# Patient Record
Sex: Male | Born: 2000 | Race: White | Hispanic: No | Marital: Single | Smoking: Never smoker
Health system: Southern US, Community
[De-identification: ages and names within clinical notes are randomized; demographics above are authoritative.]

## PROBLEM LIST (undated history)

## (undated) DIAGNOSIS — F840 Autistic disorder: Secondary | ICD-10-CM

## (undated) DIAGNOSIS — E119 Type 2 diabetes mellitus without complications: Secondary | ICD-10-CM

---
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
## 2020-09-27 DIAGNOSIS — F3181 Bipolar II disorder: Secondary | ICD-10-CM | POA: Diagnosis present

## 2021-03-07 DIAGNOSIS — M216X1 Other acquired deformities of right foot: Secondary | ICD-10-CM | POA: Insufficient documentation

## 2021-03-07 DIAGNOSIS — M2010 Hallux valgus (acquired), unspecified foot: Secondary | ICD-10-CM | POA: Insufficient documentation

## 2021-06-25 ENCOUNTER — Emergency Department
Admission: EM | Admit: 2021-06-25 | Discharge: 2021-06-25 | Disposition: A | Discharge status: Home / Self Care | Payer: Medicaid Other | Attending: Emergency Medicine | Admitting: Emergency Medicine

## 2021-06-25 ENCOUNTER — Emergency Department: Payer: Medicaid Other

## 2021-06-25 ENCOUNTER — Other Ambulatory Visit: Payer: Self-pay

## 2021-06-25 DIAGNOSIS — R112 Nausea with vomiting, unspecified: Secondary | ICD-10-CM | POA: Diagnosis not present

## 2021-06-25 DIAGNOSIS — Z20822 Contact with and (suspected) exposure to covid-19: Secondary | ICD-10-CM | POA: Insufficient documentation

## 2021-06-25 DIAGNOSIS — E860 Dehydration: Secondary | ICD-10-CM | POA: Diagnosis not present

## 2021-06-25 DIAGNOSIS — R1011 Right upper quadrant pain: Secondary | ICD-10-CM | POA: Insufficient documentation

## 2021-06-25 HISTORY — DX: Type 2 diabetes mellitus without complications: E11.9

## 2021-06-25 HISTORY — DX: Autistic disorder: F84.0

## 2021-06-25 LAB — COMPREHENSIVE METABOLIC PANEL
ALT: 12 U/L (ref 0–44)
AST: 18 U/L (ref 15–41)
Albumin: 5.3 g/dL — ABNORMAL HIGH (ref 3.5–5.0)
Alkaline Phosphatase: 153 U/L — ABNORMAL HIGH (ref 38–126)
Anion gap: 12 (ref 5–15)
BUN: 16 mg/dL (ref 6–20)
CO2: 28 mmol/L (ref 22–32)
Calcium: 10.3 mg/dL (ref 8.9–10.3)
Chloride: 94 mmol/L — ABNORMAL LOW (ref 98–111)
Creatinine, Ser: 1.41 mg/dL — ABNORMAL HIGH (ref 0.61–1.24)
GFR, Estimated: >60 mL/min (ref >60)
Glucose, Bld: 193 mg/dL — ABNORMAL HIGH (ref 70–99)
Potassium: 3.4 mmol/L — ABNORMAL LOW (ref 3.5–5.1)
Sodium: 134 mmol/L — ABNORMAL LOW (ref 135–145)
Total Bilirubin: 1.9 mg/dL — ABNORMAL HIGH (ref 0.3–1.2)
Total Protein: 8.4 g/dL — ABNORMAL HIGH (ref 6.5–8.1)

## 2021-06-25 LAB — CBC
HCT: 37.1 % — ABNORMAL LOW (ref 39.0–52.0)
Hemoglobin: 12.9 g/dL — ABNORMAL LOW (ref 13.0–17.0)
MCH: 29.5 pg (ref 26.0–34.0)
MCHC: 34.8 g/dL (ref 30.0–36.0)
MCV: 84.7 fL (ref 80.0–100.0)
Platelets: 232 10*3/uL (ref 150–400)
RBC: 4.38 MIL/uL (ref 4.22–5.81)
RDW: 12.7 % (ref 11.5–15.5)
WBC: 12.9 10*3/uL — ABNORMAL HIGH (ref 4.0–10.5)
nRBC: 0 % (ref 0.0–0.2)

## 2021-06-25 LAB — RESP PANEL BY RT-PCR (FLU A&B, COVID) ARPGX2
Influenza A by PCR: NEGATIVE
Influenza B by PCR: NEGATIVE
SARS Coronavirus 2 by RT PCR: NEGATIVE

## 2021-06-25 LAB — URINALYSIS, ROUTINE W REFLEX MICROSCOPIC
Bacteria, UA: NONE SEEN
Bilirubin Urine: NEGATIVE
Glucose, UA: NEGATIVE mg/dL
Hgb urine dipstick: NEGATIVE
Ketones, ur: 5 mg/dL — AB
Leukocytes,Ua: NEGATIVE
Nitrite: NEGATIVE
Protein, ur: NEGATIVE mg/dL
Specific Gravity, Urine: 1.01 (ref 1.005–1.030)
Squamous Epithelial / HPF: NONE SEEN (ref 0–5)
pH: 8 (ref 5.0–8.0)

## 2021-06-25 LAB — LACTIC ACID, PLASMA: Lactic Acid, Venous: 1.7 mmol/L (ref 0.5–1.9)

## 2021-06-25 LAB — LIPASE, BLOOD: Lipase: 22 U/L (ref 11–51)

## 2021-06-25 LAB — CBG MONITORING, ED: Glucose-Capillary: 185 mg/dL — ABNORMAL HIGH (ref 70–99)

## 2021-06-25 MED ORDER — SODIUM CHLORIDE 0.9 % IV BOLUS: 500.0000 mL | Freq: Once | INTRAVENOUS | Status: DC

## 2021-06-25 MED ORDER — LACTATED RINGERS IV BOLUS
1000.0000 mL | Freq: Once | INTRAVENOUS | Status: AC
  Administered 2021-06-25: 1000 mL via INTRAVENOUS

## 2021-06-25 MED ORDER — IOHEXOL 300 MG/ML  SOLN
100.0000 mL | Freq: Once | INTRAMUSCULAR | Status: AC | PRN
  Administered 2021-06-25: 100 mL via INTRAVENOUS

## 2021-06-25 MED ORDER — ONDANSETRON HCL 4 MG/2ML IJ SOLN
4.0000 mg | Freq: Once | INTRAMUSCULAR | Status: AC
  Administered 2021-06-25: 4 mg via INTRAVENOUS
  Filled 2021-06-25: qty 2

## 2021-06-25 NOTE — ED Triage Notes (Signed)
Pt in via EMS from a  Group home. Pt is autistic, bipolar and diabetic, no one from the care home is with pt. Pt has been vomiting since Thursday. Pt sitting in WR drinking his Emesis from bag. New bag given. Facility reports pt has to be given a sedative prior to getting labs or other things done. Pt reports pain to upper abd and cough as well

## 2021-06-25 NOTE — Discharge Instructions (Signed)

## 2021-06-25 NOTE — ED Triage Notes (Signed)
RN to triage room. Urine on floor, patient had large emesis on chair. Patient repeating what this RN says, will not answer any questions. No facility rep here with patient.

## 2021-06-25 NOTE — ED Notes (Signed)
Patient responds t verbal stimuli and repeats what you say to him. Skin cold and mottled. Place on monitor and 12 lead completed. IV inserted by EDP with IV ultrasound. BC drawn. Lab work sent to lab. Placed in hospital gown.

## 2021-06-25 NOTE — ED Notes (Signed)
Pt returned from CT via WC with CT tech. 

## 2021-06-25 NOTE — ED Notes (Signed)
Urine sent to lab.  Pt brought more water per request & saltine crackers for PO challenge. Pt able to keep water down so far.

## 2021-06-25 NOTE — ED Notes (Signed)
This RN called & spoke with lab re: respiratory panel not yet received but was sent at 1205; they will receive it now.

## 2021-06-25 NOTE — ED Notes (Signed)
Pt sleeping, resting comfortably in bed, NAD, chest rise & fall. No needs identified at this time. Bed low & locked; call light & personal items within reach. 

## 2021-06-25 NOTE — ED Notes (Signed)
Pt given water to drink, per request of caregiver at [facility].

## 2021-06-25 NOTE — ED Provider Notes (Signed)
Presentation and work-up is reassuring.  Patient tolerating p.o. and feels much better after IV fluids.  At this point do suspect viral illness but given his well appearance I think is appropriate for outpatient follow-up.  We discussed return precautions.  He does have as needed antiemetics at home does not need additional prescription.  No additional questions or concerns from caregiver at bedside.   Willy Eddy, MD 06/25/21 1725

## 2021-06-25 NOTE — ED Provider Notes (Signed)
[facility] Provider Note    Event Date/Time   First MD Initiated Contact with Patient 06/25/21 1138     (approximate)   History   Emesis   HPI  Phillip Barajas is a 21 y.o. male  here with reported abd pain, emesis. History limited 2/2 autism. Per report, pt has had 2 days of n/v, abd pain and discomfort. He is unable to provide any history, repeats what is asked of him on my interview.   Level 5 caveat invoked as remainder of history, ROS, and physical exam limited due to patient's autism.       Physical Exam   Triage Vital Signs: ED Triage Vitals  Enc Vitals Group     BP 06/25/21 1124 (!) 147/130     Pulse Rate 06/25/21 1125 97     Resp 06/25/21 1124 19     Temp 06/25/21 1219 98.4 F (36.9 C)     Temp Source 06/25/21 1219 Oral     SpO2 06/25/21 1125 95 %     Weight 06/25/21 1123 150 lb (68 kg)     Height 06/25/21 1123 5\' 10"  (1.778 m)     Head Circumference --      Peak Flow --      Pain Score --      Pain Loc --      Pain Edu? --      Excl. in [place]? --     Most recent vital signs: Vitals:   06/25/21 1300 06/25/21 1400  BP: (!) 138/92   Pulse: 76 83  Resp: (!) 24 15  Temp:    SpO2: 100% 100%     General: Awake, no distress.  CV:  Good peripheral perfusion. Normal rate. No murmurs, rubs, gallops. Resp:  Normal effort. Normal WOB. Clear breath sounds bilaterally. Abd:  No distention. Slight discomfort noted with RLQ/RUQ palpation. No rebound or guarding. Other:  Appears overall slightly uncomfortable, unable to provide history however - just repeats questions when asked directly. MAE. No apparent CN deficits.   ED Results / Procedures / Treatments   Labs (all labs ordered are listed, but only abnormal results are displayed) Labs Reviewed  COMPREHENSIVE METABOLIC PANEL - Abnormal; Notable for the following components:      Result Value   Sodium 134 (*)    Potassium 3.4 (*)    Chloride 94 (*)    Glucose, Bld 193 (*)     Creatinine, Ser 1.41 (*)    Total Protein 8.4 (*)    Albumin 5.3 (*)    Alkaline Phosphatase 153 (*)    Total Bilirubin 1.9 (*)    All other components within normal limits  CBC - Abnormal; Notable for the following components:   WBC 12.9 (*)    Hemoglobin 12.9 (*)    HCT 37.1 (*)    All other components within normal limits  BLOOD GAS, VENOUS - Abnormal; Notable for the following components:   pH, Ven 7.44 (*)    pO2, Ven 61.0 (*)    Bicarbonate 29.9 (*)    Acid-Base Excess 5.0 (*)    All other components within normal limits  CBG MONITORING, ED - Abnormal; Notable for the following components:   Glucose-Capillary 185 (*)    All other components within normal limits  RESP PANEL BY RT-PCR (FLU A&B, COVID) ARPGX2  CULTURE, BLOOD (SINGLE)  LIPASE, BLOOD  LACTIC ACID, PLASMA  URINALYSIS, ROUTINE W REFLEX MICROSCOPIC  BETA-HYDROXYBUTYRIC ACID  LACTIC ACID,  PLASMA     EKG     RADIOLOGY CXR: I reviewed images, which show, no acute abnormality; agree with Radiology findings CT A/P: Reviewed images, no acute abnormality noted on my read, no appendicitis seen; agree with Radiology findings    PROCEDURES:  Critical Care performed: No  None   MEDICATIONS ORDERED IN ED: Medications  sodium chloride 0.9 % bolus 500 mL (has no administration in time range)  lactated ringers bolus 1,000 mL (0 mLs Intravenous Stopped 06/25/21 1336)  ondansetron (ZOFRAN) injection 4 mg (4 mg Intravenous Given 06/25/21 1356)  lactated ringers bolus 1,000 mL (0 mLs Intravenous Stopped 06/25/21 1455)  iohexol (OMNIPAQUE) 300 MG/ML solution 100 mL (100 mLs Intravenous Contrast Given 06/25/21 1320)     IMPRESSION / MDM / ASSESSMENT AND PLAN / ED COURSE  I reviewed the triage vital signs and the nursing notes.                               Ddx: viral GI illness, DKA, food-borne illness, colitis, appendicitis, cholecystitis, PNA w/ vomiting, UTI  21 yo M with T1DM here with nausea, vomiting. Pt  reportedly vomiting x several days. Dehydrated clinically, IVF started with IV zofran for nausea. Lab work is overall fairly reassuring. Glu 193, CO2 28 and AG normal - no signs of DKA. Bili elevated at 1.9 but LFTs normal. Possible mild AKI noted though baseline Cr not known. LA is normal. Mild leukocytosis noted. Pt is afebrile and non-toxic. CT A/P obtained, reviewed, and is unremarkable. CXR also reviewed and is clear.  Suspect possible viral vs food-borne illness with n/v. Pt given fluids, antiemetics. Given hyperbilirubinemia, will check U/S as pt cannot tell me if he has RUQ pain. Otherwise, will plan to PO challenge, f/u imaging and UA, and reassess.   MEDICATIONS GIVEN IN ED: Medications  sodium chloride 0.9 % bolus 500 mL (has no administration in time range)  lactated ringers bolus 1,000 mL (0 mLs Intravenous Stopped 06/25/21 1336)  ondansetron (ZOFRAN) injection 4 mg (4 mg Intravenous Given 06/25/21 1356)  lactated ringers bolus 1,000 mL (0 mLs Intravenous Stopped 06/25/21 1455)  iohexol (OMNIPAQUE) 300 MG/ML solution 100 mL (100 mLs Intravenous Contrast Given 06/25/21 1320)       Consults: None   EMR reviewed PCP notes from 11/22, reviewed, show T1DM, o/w unremarkable.     FINAL CLINICAL IMPRESSION(S) / ED DIAGNOSES   Final diagnoses:  RUQ pain  Dehydration  Nausea and vomiting, unspecified vomiting type     Rx / DC Orders   ED Discharge Orders     None        Note:  This document was prepared using Dragon voice recognition software and may include unintentional dictation errors.   Duffy Bruce, MD 06/25/21 [phone]

## 2021-06-25 NOTE — ED Notes (Signed)
Called and spoke with Group home Production designer, [employer]tele[employer]. She reported patient sick since Thursday so she told staff to send patient via EMS to hospital as he is a Type I diabetic. She is unsure of when she will be able to send staff to sit with him. She verified he has no allergies.

## 2021-06-30 LAB — CULTURE, BLOOD (SINGLE): Culture: NO GROWTH

## 2021-07-13 LAB — BLOOD GAS, VENOUS
Acid-Base Excess: 5 mmol/L — ABNORMAL HIGH (ref 0.0–2.0)
Bicarbonate: 29.9 mmol/L — ABNORMAL HIGH (ref 20.0–28.0)
O2 Saturation: 91.9 %
Patient temperature: 37
pCO2, Ven: 44 mmHg (ref 44.0–60.0)
pH, Ven: 7.44 — ABNORMAL HIGH (ref 7.250–7.430)
pO2, Ven: 61 mmHg — ABNORMAL HIGH (ref 32.0–45.0)

## 2021-08-15 ENCOUNTER — Other Ambulatory Visit: Payer: Self-pay

## 2021-08-15 ENCOUNTER — Emergency Department
Admission: EM | Admit: 2021-08-15 | Discharge: 2021-08-15 | Disposition: A | Discharge status: Home / Self Care | Payer: Medicaid Other | Attending: Emergency Medicine | Admitting: Emergency Medicine

## 2021-08-15 DIAGNOSIS — R7989 Other specified abnormal findings of blood chemistry: Secondary | ICD-10-CM | POA: Insufficient documentation

## 2021-08-15 DIAGNOSIS — E119 Type 2 diabetes mellitus without complications: Secondary | ICD-10-CM | POA: Diagnosis not present

## 2021-08-15 DIAGNOSIS — F840 Autistic disorder: Secondary | ICD-10-CM | POA: Diagnosis not present

## 2021-08-15 DIAGNOSIS — R112 Nausea with vomiting, unspecified: Secondary | ICD-10-CM | POA: Diagnosis not present

## 2021-08-15 LAB — COMPREHENSIVE METABOLIC PANEL
ALT: 19 U/L (ref 0–44)
AST: 21 U/L (ref 15–41)
Albumin: 4.6 g/dL (ref 3.5–5.0)
Alkaline Phosphatase: 146 U/L — ABNORMAL HIGH (ref 38–126)
Anion gap: 9 (ref 5–15)
BUN: 13 mg/dL (ref 6–20)
CO2: 28 mmol/L (ref 22–32)
Calcium: 10.6 mg/dL — ABNORMAL HIGH (ref 8.9–10.3)
Chloride: 100 mmol/L (ref 98–111)
Creatinine, Ser: 1.52 mg/dL — ABNORMAL HIGH (ref 0.61–1.24)
GFR, Estimated: >60 mL/min (ref >60)
Glucose, Bld: 114 mg/dL — ABNORMAL HIGH (ref 70–99)
Potassium: 3.9 mmol/L (ref 3.5–5.1)
Sodium: 137 mmol/L (ref 135–145)
Total Bilirubin: 0.8 mg/dL (ref 0.3–1.2)
Total Protein: 7.3 g/dL (ref 6.5–8.1)

## 2021-08-15 LAB — CBC
HCT: 33.1 % — ABNORMAL LOW (ref 39.0–52.0)
Hemoglobin: 10.9 g/dL — ABNORMAL LOW (ref 13.0–17.0)
MCH: 29.3 pg (ref 26.0–34.0)
MCHC: 32.9 g/dL (ref 30.0–36.0)
MCV: 89 fL (ref 80.0–100.0)
Platelets: 266 10*3/uL (ref 150–400)
RBC: 3.72 MIL/uL — ABNORMAL LOW (ref 4.22–5.81)
RDW: 13.3 % (ref 11.5–15.5)
WBC: 9.8 10*3/uL (ref 4.0–10.5)
nRBC: 0 % (ref 0.0–0.2)

## 2021-08-15 LAB — URINALYSIS, COMPLETE (UACMP) WITH MICROSCOPIC
Bacteria, UA: NONE SEEN
Bilirubin Urine: NEGATIVE
Glucose, UA: NEGATIVE mg/dL
Hgb urine dipstick: NEGATIVE
Ketones, ur: NEGATIVE mg/dL
Leukocytes,Ua: NEGATIVE
Nitrite: NEGATIVE
Protein, ur: NEGATIVE mg/dL
Specific Gravity, Urine: 1.003 — ABNORMAL LOW (ref 1.005–1.030)
Squamous Epithelial / HPF: NONE SEEN (ref 0–5)
pH: 8 (ref 5.0–8.0)

## 2021-08-15 LAB — LIPASE, BLOOD: Lipase: 27 U/L (ref 11–51)

## 2021-08-15 MED ORDER — SODIUM CHLORIDE 0.9 % IV BOLUS
1000.0000 mL | Freq: Once | INTRAVENOUS | Status: AC
  Administered 2021-08-15: 1000 mL via INTRAVENOUS

## 2021-08-15 MED ORDER — ONDANSETRON HCL 4 MG/2ML IJ SOLN
4.0000 mg | Freq: Once | INTRAMUSCULAR | Status: AC
  Administered 2021-08-15: 4 mg via INTRAVENOUS
  Filled 2021-08-15: qty 2

## 2021-08-15 NOTE — ED Provider Notes (Signed)
[facility] Provider Note    Event Date/Time   First MD Initiated Contact with Patient 08/15/21 1332     (approximate)  History   Chief Complaint: Emesis  HPI  Phillip Barajas is a 21 y.o. male with a past medical history of autism, diabetes, presents to the emergency department for nausea and vomiting.  According to the patient's group home caregiver patient has been nauseated with intermittent episodes of vomiting over the past 4 months or so.  States the episodes of vomiting have been increasing in frequency and now occur on a daily basis.  Patient has been following up with [facility] and has an appointment for an endoscopy coming up next month.  I reviewed the patient's [facility] notes including pediatric endocrinology 08/03/2021.  Group home staff states they tried to give the patient has oral nausea medication at home but did not appear to be working so they came to the emergency department for evaluation.  Patient has autism but does not appear to be in any acute distress, lying in bed.  Does appear somewhat pale.  Physical Exam   Triage Vital Signs: ED Triage Vitals [08/15/21 1319]  Enc Vitals Group     BP      Pulse      Resp      Temp      Temp src      SpO2      Weight      Height      Head Circumference      Peak Flow      Pain Score 0     Pain Loc      Pain Edu?      Excl. in [place]?     Most recent vital signs: There were no vitals filed for this visit.  General: Awake, no distress.  Calm and cooperative. CV:  Good peripheral perfusion.  Regular rate and rhythm  Resp:  Normal effort.  Equal breath sounds bilaterally.  Abd:  No distention.  Soft, nontender.  No rebound or guarding.  No reaction to abdominal palpation.    ED Results / Procedures / Treatments   MEDICATIONS ORDERED IN ED: Medications  sodium chloride 0.9 % bolus 1,000 mL (has no administration in time range)  ondansetron (ZOFRAN) injection 4 mg (has no administration in time  range)     IMPRESSION / MDM / ASSESSMENT AND PLAN / ED COURSE  I reviewed the triage vital signs and the nursing notes.  Patient presents to the emergency department for several months of nausea and vomiting with increasing frequency, not responsive to his home nausea medication today so he came to the emergency department.  I reviewed the patient's records he is currently being worked up by [employer] appears to be waiting for GI appointment.  I reviewed the patient's ER visits and the patient had a CT scan of abdomen/pelvis last month with no acute findings.  Patient has a benign abdominal exam today.  We will recheck labs in the emergency department.  We will IV hydrate and treat the patient's nausea while awaiting results.  Group home staff agreeable.  Overall patient appears well, no distress not actively vomiting.  CBC is normal.  Chemistry is normal.  Lipase is normal.  Chemistry does show creatinine 1.5 however this is largely unchanged from prior lab work.  Urinalysis is pending.  We will continue with IV hydration.  Patient has not vomited in the emergency department since receiving medication.  Patient  care signed out to oncoming provider.  If urinalysis is normal and the patient is feeling better anticipate likely discharge home with GI follow-up.  FINAL CLINICAL IMPRESSION(S) / ED DIAGNOSES   Nausea vomiting    Note:  This document was prepared using Dragon voice recognition software and may include unintentional dictation errors.   Harvest Dark, MD 08/15/21 1511

## 2021-08-15 NOTE — Discharge Instructions (Signed)
Please seek medical attention for any high fevers, chest pain, shortness of breath, change in behavior, persistent vomiting, bloody stool or any other new or concerning symptoms.  

## 2021-08-15 NOTE — ED Notes (Signed)
Pt got up and started walking in the hallway. Redirected back in the room. Caregiver left about 1hr ago. This RN called group home and informed them that pt's ready for dc. Per Randal Buba, staff she will call pt's ride.

## 2021-08-15 NOTE — ED Triage Notes (Addendum)
Pt comes with c/o vomiting and nausea for 3 weeks. Pt is nonverbal and severe autism per EMS. CAregiver with pt at this time. EMS reports bright yellow vomit.  Pt is actively vomiting in waiting room.  Pt is from [facility] group [facility]. Mom is legal guardian per staff.

## 2021-08-26 ENCOUNTER — Encounter: Payer: Self-pay | Admitting: Emergency Medicine

## 2021-08-26 ENCOUNTER — Emergency Department: Payer: Medicaid Other

## 2021-08-26 ENCOUNTER — Other Ambulatory Visit: Payer: Self-pay

## 2021-08-26 ENCOUNTER — Emergency Department
Admission: EM | Admit: 2021-08-26 | Discharge: 2021-08-26 | Disposition: A | Discharge status: Home / Self Care | Payer: Medicaid Other | Attending: Emergency Medicine | Admitting: Emergency Medicine

## 2021-08-26 DIAGNOSIS — R569 Unspecified convulsions: Secondary | ICD-10-CM | POA: Insufficient documentation

## 2021-08-26 DIAGNOSIS — Z20822 Contact with and (suspected) exposure to covid-19: Secondary | ICD-10-CM | POA: Diagnosis not present

## 2021-08-26 DIAGNOSIS — F840 Autistic disorder: Secondary | ICD-10-CM | POA: Diagnosis not present

## 2021-08-26 LAB — COMPREHENSIVE METABOLIC PANEL
ALT: 14 U/L (ref 0–44)
AST: 23 U/L (ref 15–41)
Albumin: 4.4 g/dL (ref 3.5–5.0)
Alkaline Phosphatase: 123 U/L (ref 38–126)
Anion gap: 8 (ref 5–15)
BUN: 14 mg/dL (ref 6–20)
CO2: 27 mmol/L (ref 22–32)
Calcium: 9.8 mg/dL (ref 8.9–10.3)
Chloride: 100 mmol/L (ref 98–111)
Creatinine, Ser: 1.51 mg/dL — ABNORMAL HIGH (ref 0.61–1.24)
GFR, Estimated: >60 mL/min (ref >60)
Glucose, Bld: 114 mg/dL — ABNORMAL HIGH (ref 70–99)
Potassium: 4 mmol/L (ref 3.5–5.1)
Sodium: 135 mmol/L (ref 135–145)
Total Bilirubin: 0.6 mg/dL (ref 0.3–1.2)
Total Protein: 7.1 g/dL (ref 6.5–8.1)

## 2021-08-26 LAB — CBC WITH DIFFERENTIAL/PLATELET
Abs Immature Granulocytes: 0.01 10*3/uL (ref 0.00–0.07)
Basophils Absolute: 0 10*3/uL (ref 0.0–0.1)
Basophils Relative: 1 %
Eosinophils Absolute: 0.2 10*3/uL (ref 0.0–0.5)
Eosinophils Relative: 3 %
HCT: 32.1 % — ABNORMAL LOW (ref 39.0–52.0)
Hemoglobin: 10.5 g/dL — ABNORMAL LOW (ref 13.0–17.0)
Immature Granulocytes: 0 %
Lymphocytes Relative: 18 %
Lymphs Abs: 1.3 10*3/uL (ref 0.7–4.0)
MCH: 29 pg (ref 26.0–34.0)
MCHC: 32.7 g/dL (ref 30.0–36.0)
MCV: 88.7 fL (ref 80.0–100.0)
Monocytes Absolute: 0.5 10*3/uL (ref 0.1–1.0)
Monocytes Relative: 8 %
Neutro Abs: 5 10*3/uL (ref 1.7–7.7)
Neutrophils Relative %: 70 %
Platelets: 200 10*3/uL (ref 150–400)
RBC: 3.62 MIL/uL — ABNORMAL LOW (ref 4.22–5.81)
RDW: 13.6 % (ref 11.5–15.5)
WBC: 7 10*3/uL (ref 4.0–10.5)
nRBC: 0 % (ref 0.0–0.2)

## 2021-08-26 LAB — RESP PANEL BY RT-PCR (FLU A&B, COVID) ARPGX2
Influenza A by PCR: NEGATIVE
Influenza B by PCR: NEGATIVE
SARS Coronavirus 2 by RT PCR: NEGATIVE

## 2021-08-26 LAB — ETHANOL: Alcohol, Ethyl (B): <10 mg/dL (ref <10)

## 2021-08-26 LAB — ACETAMINOPHEN LEVEL: Acetaminophen (Tylenol), Serum: <10 ug/mL — ABNORMAL LOW (ref 10–30)

## 2021-08-26 LAB — SALICYLATE LEVEL: Salicylate Lvl: <7 mg/dL — ABNORMAL LOW (ref 7.0–30.0)

## 2021-08-26 MED ORDER — LORAZEPAM 2 MG/ML IJ SOLN
1.0000 mg | Freq: Once | INTRAMUSCULAR | Status: DC
Start: 2021-08-26 — End: 2021-08-26

## 2021-08-26 MED ORDER — LORAZEPAM 2 MG/ML IJ SOLN
INTRAMUSCULAR | Status: AC
  Filled 2021-08-26: qty 1

## 2021-08-26 MED ORDER — SODIUM CHLORIDE 0.9 % IV BOLUS
1000.0000 mL | Freq: Once | INTRAVENOUS | Status: AC
  Administered 2021-08-26: 1000 mL via INTRAVENOUS

## 2021-08-26 MED ORDER — LORAZEPAM 2 MG/ML IJ SOLN
1.0000 mg | Freq: Once | INTRAMUSCULAR | Status: AC
  Administered 2021-08-26: 1 mg via INTRAVENOUS

## 2021-08-26 NOTE — ED Notes (Signed)
Pt mother and group home owner notified of patient's arrival and status,  ?

## 2021-08-26 NOTE — ED Provider Notes (Addendum)
? ?[facility] ?Provider Note ? ? ? Event Date/Time  ? First MD Initiated Contact with Patient 08/26/21 1422   ?  (approximate) ? ? ?History  ? ?seizure like activity ? ? ?HPI ? ?Phillip Barajas is a 21 y.o. male with autism spectrum disorder who is brought to the ED due to a suspected seizure at school.  He has no seizure history.  No recent illness, no recent trauma. ? ?Patient reports that he feels cold and tired, but denies any other acute symptoms.  Denies headache or vision change.  Denies vomiting or diarrhea.  Reports that he has been eating and drinking normally. ?  ? ? ?Physical Exam  ? ?Triage Vital Signs: ?ED Triage Vitals [08/26/21 1420]  ?Enc Vitals Group  ?   BP 113/78  ?   Pulse Rate 70  ?   Resp 18  ?   Temp 98.2 ?F (36.8 ?C)  ?   Temp Source Oral  ?   SpO2 100 %  ?   Weight   ?   Height   ?   Head Circumference   ?   Peak Flow   ?   Pain Score   ?   Pain Loc   ?   Pain Edu?   ?   Excl. in [place]?   ? ? ?Most recent vital signs: ?Vitals:  ? 08/26/21 1420  ?BP: 113/78  ?Pulse: 70  ?Resp: 18  ?Temp: 98.2 ?F (36.8 ?C)  ?SpO2: 100%  ? ? ? ?General: Awake, no distress.  ?CV:  Good peripheral perfusion.  Regular rate and rhythm ?Resp:  Normal effort.  Clear to auscultation bilaterally ?Abd:  No distention.  Moist oral mucosa ?Other:  No tongue biting, no wounds.  Full range of motion all extremities.  No nystagmus.  PERRL, EOMI. ? ? ?ED Results / Procedures / Treatments  ? ?Labs ?(all labs ordered are listed, but only abnormal results are displayed) ?Labs Reviewed  ?CBC WITH DIFFERENTIAL/PLATELET - Abnormal; Notable for the following components:  ?    Result Value  ? RBC 3.62 (*)   ? Hemoglobin 10.5 (*)   ? HCT 32.1 (*)   ? All other components within normal limits  ?RESP PANEL BY RT-PCR (FLU A&B, COVID) ARPGX2  ?ACETAMINOPHEN LEVEL  ?COMPREHENSIVE METABOLIC PANEL  ?ETHANOL  ?SALICYLATE LEVEL  ?URINALYSIS, ROUTINE W REFLEX MICROSCOPIC  ?URINE DRUG SCREEN, QUALITATIVE ([facility] ONLY)   ? ? ? ?EKG ? ?EKG viewed and interpreted by me, normal sinus rhythm rate of 71.  Normal axis, normal intervals.  Normal QRS ST segments and T waves. ? ? ?RADIOLOGY ?CT head viewed and by me, negative for mass or intracranial hemorrhage.  Radiology report reviewed. ? ? ? ?PROCEDURES: ? ?Critical Care performed: No ? ?Procedures ? ? ?MEDICATIONS ORDERED IN ED: ?Medications  ?sodium chloride 0.9 % bolus 1,000 mL (has no administration in time range)  ?LORazepam (ATIVAN) 2 MG/ML injection (has no administration in time range)  ?LORazepam (ATIVAN) injection 1 mg (1 mg Intravenous Given 08/26/21 1423)  ? ? ? ?IMPRESSION / MDM / ASSESSMENT AND PLAN / ED COURSE  ?I reviewed the triage vital signs and the nursing notes. ?             ?               ? ?Differential diagnosis includes, but is not limited to, intracranial hemorrhage, new onset seizure, dehydration, electrode abnormality, ingestion, syncope ? ?Patient presents with  a suspected seizure without past history.  Will obtain labs, COVID/flu swab, CT head. ? ? ?Clinical Course as of 08/26/21 1445  ?Fri Aug 26, 2021  ?1444 CT head viewed and interpreted by me, negative for mass or intracranial hemorrhage.  Radiology report reviewed. [PS]  ?  ?Clinical Course User Index ?[PS] Sharman Cheek, MD  ? ? ? ?FINAL CLINICAL IMPRESSION(S) / ED DIAGNOSES  ? ?Final diagnoses:  ?Seizure-like activity (H[facility])  ? ? ? ?Rx / DC Orders  ? ?ED Discharge Orders   ? ? None  ? ?  ? ? ? ?Note:  This document was prepared using Dragon voice recognition software and may include unintentional dictation errors. ?  ?Sharman Cheek, MD ?08/26/21 1444 ? ?  ?Sharman Cheek, MD ?08/26/21 1505 ? ?

## 2021-08-26 NOTE — ED Notes (Signed)
This RN attempted to contact mother with no answer at this time. 

## 2021-08-26 NOTE — ED Notes (Signed)
Group home staff member here to pick patient up. E-signature not working at this time. Group home staff member verbalized understanding of D/C instructions, prescriptions and follow up care with no further questions at this time. Pt in NAD and ambulatory at time of D/C. ? ?

## 2021-08-26 NOTE — ED Triage Notes (Signed)
Pt presents via ace[facility] from highschool with c/o seizure like activity. Patient was reported to have fallen during class and had "shaking" activity that lasted about one minute. Pt was reported to be lethargic after episode. Upon arrival, patient lethargic and will answer yes or no to some questions. Per group home, this is patient's baseline verbal status.  ?

## 2021-09-02 DIAGNOSIS — R569 Unspecified convulsions: Secondary | ICD-10-CM | POA: Insufficient documentation

## 2021-09-12 ENCOUNTER — Emergency Department: Payer: Medicaid Other

## 2021-09-12 ENCOUNTER — Encounter: Payer: Self-pay | Admitting: Intensive Care

## 2021-09-12 ENCOUNTER — Other Ambulatory Visit: Payer: Self-pay

## 2021-09-12 ENCOUNTER — Emergency Department
Admission: EM | Admit: 2021-09-12 | Discharge: 2021-09-12 | Disposition: A | Discharge status: Home / Self Care | Payer: Medicaid Other | Source: Home / Self Care | Attending: Emergency Medicine | Admitting: Emergency Medicine

## 2021-09-12 ENCOUNTER — Encounter: Payer: Self-pay | Admitting: Emergency Medicine

## 2021-09-12 ENCOUNTER — Telehealth: Payer: Self-pay

## 2021-09-12 ENCOUNTER — Inpatient Hospital Stay
Admission: EM | Admit: 2021-09-12 | Discharge: 2021-09-14 | DRG: 918 | Disposition: A | Discharge status: Home / Self Care | Payer: Medicaid Other | Attending: Internal Medicine | Admitting: Internal Medicine

## 2021-09-12 DIAGNOSIS — F3181 Bipolar II disorder: Secondary | ICD-10-CM | POA: Diagnosis present

## 2021-09-12 DIAGNOSIS — W01198A Fall on same level from slipping, tripping and stumbling with subsequent striking against other object, initial encounter: Secondary | ICD-10-CM | POA: Insufficient documentation

## 2021-09-12 DIAGNOSIS — R6250 Unspecified lack of expected normal physiological development in childhood: Secondary | ICD-10-CM | POA: Diagnosis present

## 2021-09-12 DIAGNOSIS — T43591A Poisoning by other antipsychotics and neuroleptics, accidental (unintentional), initial encounter: Principal | ICD-10-CM | POA: Diagnosis present

## 2021-09-12 DIAGNOSIS — E10649 Type 1 diabetes mellitus with hypoglycemia without coma: Secondary | ICD-10-CM

## 2021-09-12 DIAGNOSIS — S0081XA Abrasion of other part of head, initial encounter: Secondary | ICD-10-CM | POA: Insufficient documentation

## 2021-09-12 DIAGNOSIS — Y92219 Unspecified school as the place of occurrence of the external cause: Secondary | ICD-10-CM | POA: Insufficient documentation

## 2021-09-12 DIAGNOSIS — F840 Autistic disorder: Secondary | ICD-10-CM

## 2021-09-12 DIAGNOSIS — R569 Unspecified convulsions: Secondary | ICD-10-CM | POA: Insufficient documentation

## 2021-09-12 DIAGNOSIS — E109 Type 1 diabetes mellitus without complications: Secondary | ICD-10-CM | POA: Diagnosis present

## 2021-09-12 DIAGNOSIS — W1830XA Fall on same level, unspecified, initial encounter: Secondary | ICD-10-CM | POA: Diagnosis present

## 2021-09-12 DIAGNOSIS — R112 Nausea with vomiting, unspecified: Secondary | ICD-10-CM

## 2021-09-12 DIAGNOSIS — Z79899 Other long term (current) drug therapy: Secondary | ICD-10-CM

## 2021-09-12 DIAGNOSIS — T56891A Toxic effect of other metals, accidental (unintentional), initial encounter: Principal | ICD-10-CM

## 2021-09-12 DIAGNOSIS — E119 Type 2 diabetes mellitus without complications: Secondary | ICD-10-CM

## 2021-09-12 DIAGNOSIS — F909 Attention-deficit hyperactivity disorder, unspecified type: Secondary | ICD-10-CM

## 2021-09-12 DIAGNOSIS — N179 Acute kidney failure, unspecified: Secondary | ICD-10-CM | POA: Clinically undetermined

## 2021-09-12 DIAGNOSIS — Z20822 Contact with and (suspected) exposure to covid-19: Secondary | ICD-10-CM | POA: Diagnosis present

## 2021-09-12 DIAGNOSIS — G40909 Epilepsy, unspecified, not intractable, without status epilepticus: Secondary | ICD-10-CM | POA: Diagnosis present

## 2021-09-12 DIAGNOSIS — Z794 Long term (current) use of insulin: Secondary | ICD-10-CM

## 2021-09-12 DIAGNOSIS — R7989 Other specified abnormal findings of blood chemistry: Secondary | ICD-10-CM | POA: Insufficient documentation

## 2021-09-12 DIAGNOSIS — Z7984 Long term (current) use of oral hypoglycemic drugs: Secondary | ICD-10-CM

## 2021-09-12 LAB — CBC
HCT: 34.9 % — ABNORMAL LOW (ref 39.0–52.0)
Hemoglobin: 11.4 g/dL — ABNORMAL LOW (ref 13.0–17.0)
MCH: 29.1 pg (ref 26.0–34.0)
MCHC: 32.7 g/dL (ref 30.0–36.0)
MCV: 89 fL (ref 80.0–100.0)
Platelets: 232 10*3/uL (ref 150–400)
RBC: 3.92 MIL/uL — ABNORMAL LOW (ref 4.22–5.81)
RDW: 14 % (ref 11.5–15.5)
WBC: 11 10*3/uL — ABNORMAL HIGH (ref 4.0–10.5)
nRBC: 0 % (ref 0.0–0.2)

## 2021-09-12 LAB — BASIC METABOLIC PANEL
Anion gap: 10 (ref 5–15)
BUN: 12 mg/dL (ref 6–20)
CO2: 24 mmol/L (ref 22–32)
Calcium: 9.9 mg/dL (ref 8.9–10.3)
Chloride: 103 mmol/L (ref 98–111)
Creatinine, Ser: 1.41 mg/dL — ABNORMAL HIGH (ref 0.61–1.24)
GFR, Estimated: >60 mL/min (ref >60)
Glucose, Bld: 103 mg/dL — ABNORMAL HIGH (ref 70–99)
Potassium: 4.1 mmol/L (ref 3.5–5.1)
Sodium: 137 mmol/L (ref 135–145)

## 2021-09-12 LAB — URINALYSIS, ROUTINE W REFLEX MICROSCOPIC
Bilirubin Urine: NEGATIVE
Glucose, UA: NEGATIVE mg/dL
Hgb urine dipstick: NEGATIVE
Ketones, ur: NEGATIVE mg/dL
Leukocytes,Ua: NEGATIVE
Nitrite: NEGATIVE
Protein, ur: NEGATIVE mg/dL
Specific Gravity, Urine: 1.005 (ref 1.005–1.030)
pH: 8 (ref 5.0–8.0)

## 2021-09-12 LAB — CBC WITH DIFFERENTIAL/PLATELET
Abs Immature Granulocytes: 0.02 10*3/uL (ref 0.00–0.07)
Basophils Absolute: 0.1 10*3/uL (ref 0.0–0.1)
Basophils Relative: 1 %
Eosinophils Absolute: 0.2 10*3/uL (ref 0.0–0.5)
Eosinophils Relative: 3 %
HCT: 34.7 % — ABNORMAL LOW (ref 39.0–52.0)
Hemoglobin: 11.1 g/dL — ABNORMAL LOW (ref 13.0–17.0)
Immature Granulocytes: 0 %
Lymphocytes Relative: 15 %
Lymphs Abs: 1.1 10*3/uL (ref 0.7–4.0)
MCH: 29 pg (ref 26.0–34.0)
MCHC: 32 g/dL (ref 30.0–36.0)
MCV: 90.6 fL (ref 80.0–100.0)
Monocytes Absolute: 0.5 10*3/uL (ref 0.1–1.0)
Monocytes Relative: 7 %
Neutro Abs: 5.8 10*3/uL (ref 1.7–7.7)
Neutrophils Relative %: 74 %
Platelets: 198 10*3/uL (ref 150–400)
RBC: 3.83 MIL/uL — ABNORMAL LOW (ref 4.22–5.81)
RDW: 14.1 % (ref 11.5–15.5)
WBC: 7.8 10*3/uL (ref 4.0–10.5)
nRBC: 0 % (ref 0.0–0.2)

## 2021-09-12 LAB — LITHIUM LEVEL: Lithium Lvl: 1.7 mmol/L (ref 0.60–1.20)

## 2021-09-12 MED ORDER — SODIUM CHLORIDE 0.9 % IV BOLUS
1000.0000 mL | Freq: Once | INTRAVENOUS | Status: AC
Start: 2021-09-12 — End: 2021-09-13
  Administered 2021-09-12: 1000 mL via INTRAVENOUS

## 2021-09-12 MED ORDER — ONDANSETRON HCL 4 MG/2ML IJ SOLN
4.0000 mg | Freq: Once | INTRAMUSCULAR | Status: AC
Start: 2021-09-12 — End: 2021-09-12
  Administered 2021-09-12: 4 mg via INTRAVENOUS
  Filled 2021-09-12: qty 2

## 2021-09-12 MED ORDER — GABAPENTIN 400 MG PO CAPS: ORAL_CAPSULE | ORAL | 2 refills | Status: DC

## 2021-09-12 MED ORDER — IOHEXOL 300 MG/ML  SOLN
100.0000 mL | Freq: Once | INTRAMUSCULAR | Status: AC | PRN
  Administered 2021-09-12: 100 mL via INTRAVENOUS

## 2021-09-12 MED ORDER — LAMOTRIGINE 25 MG PO TABS: 25.0000 mg | ORAL_TABLET | Freq: Two times a day (BID) | ORAL | 0 refills | Status: DC

## 2021-09-12 NOTE — ED Triage Notes (Signed)
Arrived by EMS from school. Patient hit left side of face when falling due to seizure. By standers report he stiffened up and hit Left forehead, shaking about 2 minutes. EMS reports postictal at scene. EMS vitals HR 63, 98% RA, 119/80 blood pressure, blood sugar 144. Baseline patient mainly non verbal. Can follow some commands. Lives at [facility] [place], [place] [phone].  ?

## 2021-09-12 NOTE — ED Notes (Signed)
School officer at bedside at this time ?

## 2021-09-12 NOTE — ED Triage Notes (Signed)
ED called Group Home and instructed them to bring patient back because lithium level is high at 1.7 ? ?Awake and alert to baseline.  NAD ?

## 2021-09-12 NOTE — ED Provider Notes (Signed)
? ?[facility] ?Provider Note ? ? ? Event Date/Time  ? First MD Initiated Contact with Patient 09/12/21 1119   ?  (approximate) ? ? ?History  ? ?Seizures ? ? ?HPI ? ?Garland Lyke is a 21 y.o. male with a past medical history of autism spectrum disorder, ADHD, developmental delay and eczema and recent ED evaluation on 3/10 for possible seizure-like activity having follow-up with neurology on 3/16 not started on AEDs at that time who presents via EMS from school where he had an episode concerning for possible seizure-like activity.  He was witnessed to suddenly stiffen and fall hitting his head against the ground.  Per EMS he was postictal with them.  No seizure-like activity witnessed by EMS.  Per EMS report review of records it seems patient is minimally verbal at baseline.  He is unable to contribute any meaningful history on arrival. ? ?I was able to reach staff members group home with Gerald Stabs who confirmed patient baseline is close to nonverbal only able to answer yes or no to some questions and it there have been no other recent sick symptoms or injuries.  He also confirms that patient has not had any changes to medications and only takes lorazepam as needed very intermittently but not daily. ?  ? ? ?Physical Exam  ?Triage Vital Signs: ?ED Triage Vitals  ?Enc Vitals Group  ?   BP   ?   Pulse   ?   Resp   ?   Temp   ?   Temp src   ?   SpO2   ?   Weight   ?   Height   ?   Head Circumference   ?   Peak Flow   ?   Pain Score   ?   Pain Loc   ?   Pain Edu?   ?   Excl. in [place]?   ? ? ?Most recent vital signs: ?Vitals:  ? 09/12/21 1122 09/12/21 1127  ?BP: 125/82   ?Pulse: 68   ?Resp: 14   ?Temp:  98.6 ?F (37 ?C)  ?SpO2: 100%   ? ? ?General: Awake,  ?CV:  Good peripheral perfusion.  2+ radial pulses. ?Resp:  Normal effort.  ?Abd:  No distention.  ?Other:  Patient does not answer any direct questions but is able to assist examiner thumbs up with both hands and raise both legs off the bed on command.   PERRLA.  EOMI.  He has an abrasion of the left side of his face just to the lateral aspect of the eyebrow as well as a very small laceration in the inferior left lower lip.  No other obvious trauma to the face scalp head or neck.  He does not seem tender anywhere along the C/T/L-spine.  No other obvious trauma to the extremities chest abdomen or back.  Oropharynx and ears are unremarkable.  No neck rigidity. ? ? ?ED Results / Procedures / Treatments  ?Labs ?(all labs ordered are listed, but only abnormal results are displayed) ?Labs Reviewed  ?CBC WITH DIFFERENTIAL/PLATELET - Abnormal; Notable for the following components:  ?    Result Value  ? RBC 3.83 (*)   ? Hemoglobin 11.1 (*)   ? HCT 34.7 (*)   ? All other components within normal limits  ?BASIC METABOLIC PANEL - Abnormal; Notable for the following components:  ? Glucose, Bld 103 (*)   ? Creatinine, Ser 1.41 (*)   ? All other components within normal  limits  ?LITHIUM LEVEL  ? ? ? ?EKG ? ?ECG is remarkable for sinus rhythm with a ventricular rate of 66, otherwise unremarkable intervals, normal axis without evidence of acute ischemia or significant arrhythmia. ? ? ?RADIOLOGY ?CT head and C-spine interpreted by myself without evidence of a skull fracture, intracranial hemorrhage or acute C-spine injury.  I also reviewed radiology interpretation and agree with the findings of same. ? ? ?PROCEDURES: ? ?Critical Care performed: No ? ?Procedures ? ? ?MEDICATIONS ORDERED IN ED: ?Medications - No data to display ? ? ?IMPRESSION / MDM / ASSESSMENT AND PLAN / ED COURSE  ?I reviewed the triage vital signs and the nursing notes. ?             ?               ? ?Differential diagnosis includes, but is not limited to arrhythmia, metabolic derangements, possible recurrent seizure with lower suspicion based on the exam for an infectious process.  Based on history from caregivers I have a lower suspicion for benzo withdrawal. ? ?Given patient's limited history and her to fall  with evidence on exam of some facial injury will obtain a CT head and C-spine. ? ?CT head and C-spine interpreted by myself without evidence of a skull fracture, intracranial hemorrhage or acute C-spine injury.  I also reviewed radiology interpretation and agree with the findings of same. ? ?BMP shows no significant electrolyte or metabolic derangements.  Kidney function is at baseline with a creatinine of 1.41 compared to 1.512 weeks ago.  CBC without leukocytosis or acute anemia. ? ?I reviewed patient's most recent neurology note from 3/16 where it seems there was consideration of starting patient on Lamictal for additional seizure activity. ? ?I discussed patient's presentation and work-up with on-call neurologist Dr. Leonel Ramsay who recommended increasing patient's gabapentin to 400 mg in the morning and 800 mg in the evening as well as starting patient on Lamictal as discussed in previous neurology note.  I will have patient follow-up with Dr. Melrose Nakayama his neurologist.  Low suspicion for other immediate life-threatening process.  Discharged in stable condition. ? ?  ? ? ?FINAL CLINICAL IMPRESSION(S) / ED DIAGNOSES  ? ?Final diagnoses:  ?Seizure-like activity ([place])  ?Abrasion of face, initial encounter  ? ? ? ?Rx / DC Orders  ? ?ED Discharge Orders   ? ?      Ordered  ?  lamoTRIgine (LAMICTAL) 25 MG tablet  2 times daily       ? 09/12/21 1309  ?  gabapentin (NEURONTIN) 400 MG capsule       ? 09/12/21 1309  ? ?  ?  ? ?  ? ? ? ?Note:  This document was prepared using Dragon voice recognition software and may include unintentional dictation errors. ?  ?Lucrezia Starch, MD ?09/12/21 1311 ? ?

## 2021-09-12 NOTE — ED Notes (Signed)
Bed alarm hooked up at this time to monitor patient getting out of bed ?

## 2021-09-12 NOTE — ED Notes (Signed)
Pt taken to CT.

## 2021-09-13 DIAGNOSIS — N179 Acute kidney failure, unspecified: Secondary | ICD-10-CM | POA: Diagnosis not present

## 2021-09-13 DIAGNOSIS — S0081XA Abrasion of other part of head, initial encounter: Secondary | ICD-10-CM | POA: Diagnosis present

## 2021-09-13 DIAGNOSIS — Z7984 Long term (current) use of oral hypoglycemic drugs: Secondary | ICD-10-CM | POA: Diagnosis not present

## 2021-09-13 DIAGNOSIS — T56891A Toxic effect of other metals, accidental (unintentional), initial encounter: Secondary | ICD-10-CM

## 2021-09-13 DIAGNOSIS — Z20822 Contact with and (suspected) exposure to covid-19: Secondary | ICD-10-CM | POA: Diagnosis present

## 2021-09-13 DIAGNOSIS — Z794 Long term (current) use of insulin: Secondary | ICD-10-CM | POA: Diagnosis not present

## 2021-09-13 DIAGNOSIS — F3181 Bipolar II disorder: Secondary | ICD-10-CM | POA: Diagnosis present

## 2021-09-13 DIAGNOSIS — F909 Attention-deficit hyperactivity disorder, unspecified type: Secondary | ICD-10-CM

## 2021-09-13 DIAGNOSIS — Z79899 Other long term (current) drug therapy: Secondary | ICD-10-CM | POA: Diagnosis not present

## 2021-09-13 DIAGNOSIS — R6250 Unspecified lack of expected normal physiological development in childhood: Secondary | ICD-10-CM | POA: Diagnosis present

## 2021-09-13 DIAGNOSIS — R569 Unspecified convulsions: Secondary | ICD-10-CM | POA: Diagnosis not present

## 2021-09-13 DIAGNOSIS — T43591A Poisoning by other antipsychotics and neuroleptics, accidental (unintentional), initial encounter: Secondary | ICD-10-CM | POA: Diagnosis not present

## 2021-09-13 DIAGNOSIS — F840 Autistic disorder: Secondary | ICD-10-CM | POA: Diagnosis present

## 2021-09-13 DIAGNOSIS — E119 Type 2 diabetes mellitus without complications: Secondary | ICD-10-CM

## 2021-09-13 DIAGNOSIS — G40909 Epilepsy, unspecified, not intractable, without status epilepticus: Secondary | ICD-10-CM | POA: Diagnosis present

## 2021-09-13 DIAGNOSIS — E109 Type 1 diabetes mellitus without complications: Secondary | ICD-10-CM | POA: Diagnosis present

## 2021-09-13 DIAGNOSIS — R112 Nausea with vomiting, unspecified: Secondary | ICD-10-CM

## 2021-09-13 DIAGNOSIS — W1830XA Fall on same level, unspecified, initial encounter: Secondary | ICD-10-CM | POA: Diagnosis present

## 2021-09-13 HISTORY — DX: Toxic effect of other metals, accidental (unintentional), initial encounter: T56.891A

## 2021-09-13 LAB — CBG MONITORING, ED
Glucose-Capillary: 170 mg/dL — ABNORMAL HIGH (ref 70–99)
Glucose-Capillary: 152 mg/dL — ABNORMAL HIGH (ref 70–99)
Glucose-Capillary: 138 mg/dL — ABNORMAL HIGH (ref 70–99)

## 2021-09-13 LAB — LIPASE, BLOOD: Lipase: 27 U/L (ref 11–51)

## 2021-09-13 LAB — COMPREHENSIVE METABOLIC PANEL
ALT: 11 U/L (ref 0–44)
AST: 18 U/L (ref 15–41)
Albumin: 4.7 g/dL (ref 3.5–5.0)
Alkaline Phosphatase: 107 U/L (ref 38–126)
Anion gap: 10 (ref 5–15)
BUN: 14 mg/dL (ref 6–20)
CO2: 28 mmol/L (ref 22–32)
Calcium: 10 mg/dL (ref 8.9–10.3)
Chloride: 105 mmol/L (ref 98–111)
Creatinine, Ser: 1.53 mg/dL — ABNORMAL HIGH (ref 0.61–1.24)
GFR, Estimated: >60 mL/min (ref >60)
Glucose, Bld: 108 mg/dL — ABNORMAL HIGH (ref 70–99)
Potassium: 3.6 mmol/L (ref 3.5–5.1)
Sodium: 143 mmol/L (ref 135–145)
Total Bilirubin: 1 mg/dL (ref 0.3–1.2)
Total Protein: 7.7 g/dL (ref 6.5–8.1)

## 2021-09-13 LAB — RESP PANEL BY RT-PCR (FLU A&B, COVID) ARPGX2
Influenza A by PCR: NEGATIVE
Influenza B by PCR: NEGATIVE
SARS Coronavirus 2 by RT PCR: NEGATIVE

## 2021-09-13 LAB — LITHIUM LEVEL
Lithium Lvl: 1.93 mmol/L (ref 0.60–1.20)
Lithium Lvl: 1.61 mmol/L (ref 0.60–1.20)

## 2021-09-13 LAB — HIV ANTIBODY (ROUTINE TESTING W REFLEX): HIV Screen 4th Generation wRfx: NONREACTIVE

## 2021-09-13 LAB — HEMOGLOBIN A1C
Hgb A1c MFr Bld: 4.7 % — ABNORMAL LOW (ref 4.8–5.6)
Mean Plasma Glucose: 88.19 mg/dL

## 2021-09-13 LAB — GLUCOSE, CAPILLARY
Glucose-Capillary: 100 mg/dL — ABNORMAL HIGH (ref 70–99)
Glucose-Capillary: 141 mg/dL — ABNORMAL HIGH (ref 70–99)

## 2021-09-13 LAB — GROUP A STREP BY PCR: Group A Strep by PCR: NOT DETECTED

## 2021-09-13 MED ORDER — INSULIN ASPART 100 UNIT/ML IJ SOLN: 0.0000 [IU] | Freq: Every day | INTRAMUSCULAR | Status: DC

## 2021-09-13 MED ORDER — RISPERIDONE 0.5 MG PO TABS
2.0000 mg | ORAL_TABLET | Freq: Every day | ORAL | Status: DC
Start: 2021-09-13 — End: 2021-09-14
  Administered 2021-09-13 – 2021-09-14 (×2): 2 mg via ORAL
  Filled 2021-09-13: qty 4
  Filled 2021-09-13: qty 2

## 2021-09-13 MED ORDER — LORAZEPAM 2 MG/ML IJ SOLN: 2.0000 mg | INTRAMUSCULAR | Status: DC | PRN

## 2021-09-13 MED ORDER — HYDROCODONE-ACETAMINOPHEN 5-325 MG PO TABS: 1.0000 | ORAL_TABLET | ORAL | Status: DC | PRN

## 2021-09-13 MED ORDER — LORATADINE 10 MG PO TABS
10.0000 mg | ORAL_TABLET | Freq: Every day | ORAL | Status: DC
  Administered 2021-09-13 – 2021-09-14 (×2): 10 mg via ORAL
  Filled 2021-09-13 (×2): qty 1

## 2021-09-13 MED ORDER — INSULIN ASPART 100 UNIT/ML IJ SOLN
0.0000 [IU] | Freq: Three times a day (TID) | INTRAMUSCULAR | Status: DC
  Administered 2021-09-13: 3 [IU] via SUBCUTANEOUS
  Administered 2021-09-13 – 2021-09-14 (×2): 2 [IU] via SUBCUTANEOUS
  Administered 2021-09-14: 3 [IU] via SUBCUTANEOUS
  Filled 2021-09-13 (×4): qty 1

## 2021-09-13 MED ORDER — ACETAMINOPHEN 325 MG PO TABS: 650.0000 mg | ORAL_TABLET | Freq: Four times a day (QID) | ORAL | Status: DC | PRN

## 2021-09-13 MED ORDER — INSULIN GLARGINE-YFGN 100 UNIT/ML ~~LOC~~ SOLN
25.0000 [IU] | Freq: Every day | SUBCUTANEOUS | Status: DC
  Filled 2021-09-13: qty 0.25

## 2021-09-13 MED ORDER — LAMOTRIGINE 25 MG PO TABS
25.0000 mg | ORAL_TABLET | Freq: Two times a day (BID) | ORAL | Status: DC
  Administered 2021-09-13 – 2021-09-14 (×4): 25 mg via ORAL
  Filled 2021-09-13 (×5): qty 1

## 2021-09-13 MED ORDER — ENOXAPARIN SODIUM 40 MG/0.4ML IJ SOSY
40.0000 mg | PREFILLED_SYRINGE | INTRAMUSCULAR | Status: DC
  Administered 2021-09-13 – 2021-09-14 (×2): 40 mg via SUBCUTANEOUS
  Filled 2021-09-13 (×2): qty 0.4

## 2021-09-13 MED ORDER — ONDANSETRON HCL 4 MG PO TABS: 4.0000 mg | ORAL_TABLET | Freq: Four times a day (QID) | ORAL | Status: DC | PRN

## 2021-09-13 MED ORDER — MELATONIN 5 MG PO TABS
10.0000 mg | ORAL_TABLET | Freq: Every day | ORAL | Status: DC
  Administered 2021-09-13 (×2): 10 mg via ORAL
  Filled 2021-09-13 (×2): qty 2

## 2021-09-13 MED ORDER — ACETAMINOPHEN 650 MG RE SUPP
650.0000 mg | Freq: Four times a day (QID) | RECTAL | Status: DC | PRN
Start: 2021-09-13 — End: 2021-09-14

## 2021-09-13 MED ORDER — INSULIN GLARGINE-YFGN 100 UNIT/ML ~~LOC~~ SOLN
25.0000 [IU] | Freq: Every morning | SUBCUTANEOUS | Status: DC
  Administered 2021-09-14: 25 [IU] via SUBCUTANEOUS
  Filled 2021-09-13 (×2): qty 0.25

## 2021-09-13 MED ORDER — GABAPENTIN 300 MG PO CAPS
400.0000 mg | ORAL_CAPSULE | Freq: Every morning | ORAL | Status: DC
  Administered 2021-09-13 – 2021-09-14 (×2): 400 mg via ORAL
  Filled 2021-09-13 (×2): qty 1

## 2021-09-13 MED ORDER — AMANTADINE HCL 100 MG PO CAPS
200.0000 mg | ORAL_CAPSULE | Freq: Every day | ORAL | Status: DC
Start: 2021-09-13 — End: 2021-09-14
  Administered 2021-09-13 – 2021-09-14 (×2): 200 mg via ORAL
  Filled 2021-09-13 (×3): qty 2

## 2021-09-13 MED ORDER — GABAPENTIN 400 MG PO CAPS
800.0000 mg | ORAL_CAPSULE | Freq: Every day | ORAL | Status: DC
  Administered 2021-09-13 (×2): 800 mg via ORAL
  Filled 2021-09-13 (×3): qty 2

## 2021-09-13 MED ORDER — LORAZEPAM 1 MG PO TABS
1.0000 mg | ORAL_TABLET | Freq: Four times a day (QID) | ORAL | Status: DC | PRN
  Administered 2021-09-13 (×2): 1 mg via ORAL
  Filled 2021-09-13 (×2): qty 1

## 2021-09-13 MED ORDER — SENNA 8.6 MG PO TABS: 1.0000 | ORAL_TABLET | Freq: Every evening | ORAL | Status: DC | PRN

## 2021-09-13 MED ORDER — FLUTICASONE PROPIONATE 50 MCG/ACT NA SUSP
2.0000 | Freq: Every day | NASAL | Status: DC
  Administered 2021-09-13: 2 via NASAL
  Filled 2021-09-13: qty 16

## 2021-09-13 MED ORDER — LACTATED RINGERS IV SOLN: INTRAVENOUS | Status: DC

## 2021-09-13 MED ORDER — ONDANSETRON HCL 4 MG/2ML IJ SOLN: 4.0000 mg | Freq: Four times a day (QID) | INTRAMUSCULAR | Status: DC | PRN

## 2021-09-13 MED ORDER — FLUOXETINE HCL 10 MG PO CAPS
10.0000 mg | ORAL_CAPSULE | Freq: Every day | ORAL | Status: DC
  Administered 2021-09-13 – 2021-09-14 (×2): 10 mg via ORAL
  Filled 2021-09-13 (×2): qty 1

## 2021-09-13 MED ORDER — RISPERIDONE 0.5 MG PO TABS
2.0000 mg | ORAL_TABLET | Freq: Two times a day (BID) | ORAL | Status: DC
  Administered 2021-09-13 – 2021-09-14 (×4): 2 mg via ORAL
  Filled 2021-09-13 (×2): qty 2
  Filled 2021-09-13 (×2): qty 4

## 2021-09-13 MED ORDER — CLONIDINE HCL 0.1 MG PO TABS
0.1000 mg | ORAL_TABLET | Freq: Two times a day (BID) | ORAL | Status: DC
  Administered 2021-09-13 – 2021-09-14 (×4): 0.1 mg via ORAL
  Filled 2021-09-13 (×4): qty 1

## 2021-09-13 MED ORDER — AMANTADINE HCL 100 MG PO CAPS
100.0000 mg | ORAL_CAPSULE | Freq: Two times a day (BID) | ORAL | Status: DC
  Administered 2021-09-13 – 2021-09-14 (×3): 100 mg via ORAL
  Filled 2021-09-13 (×4): qty 1

## 2021-09-13 MED ORDER — CLONIDINE HCL 0.1 MG PO TABS
0.0500 mg | ORAL_TABLET | Freq: Every day | ORAL | Status: DC
Start: 2021-09-13 — End: 2021-09-14
  Administered 2021-09-13 – 2021-09-14 (×2): 0.05 mg via ORAL
  Filled 2021-09-13 (×2): qty 1

## 2021-09-13 NOTE — ED Notes (Signed)
Pt given medication and then unhooked to ambulate to toilet. Pt then returned to bed with instructions from RN. Pt very directable.  ?

## 2021-09-13 NOTE — ED Notes (Signed)
Pt up to bathroom at this time.  Denies any other needs. ?

## 2021-09-13 NOTE — Progress Notes (Addendum)
?PROGRESS NOTE ? ? ?HPI was taken from Dr. Para March: ?Phillip Barajas is a 21 y.o. male with medical history significant for Type 1 diabetes, autism, ADHD, developmental delay seen earlier in the ED for seizure-like activity, with neurology consult with Dr. Amada Jupiter recommending increasing gabapentin and starting Lamictal, discharged back to group home who was called back for admission due to an elevated lithium level that returned after his discharge from the emergency room.  On his return to the ED patient was vomiting and was complaining of abdominal pain. ?ED course: Vitals within normal limits.  Blood work with lithium level of 1.93, WBC 11,000, hemoglobin 11.4 and creatinine 1.53.  COVID and flu negative, strep test negative, urinalysis unremarkable, lipase and LFTs WNL.  CT abdomen and pelvis showed no active process. ?Patient started on IV hydration.  Observation admit requested.  ?  ? ? ?Phillip Barajas  EXH:[id number] DOB: March 23, 2001 DOA: 09/12/2021 ?PCP: Pcp, No  ? ?Assessment & Plan: ?  ?Principal Problem: ?  Lithium toxicity ?Active Problems: ?  Nausea and vomiting ?  Recent onset seizure disorder (HCC) ?  Autism ?  Diabetes mellitus without complication (HCC) ?  ADHD ?  Bipolar 2 disorder (HCC) ? ? ?Lithium toxicity: possibly incidental. Lithium level is still elevated but trending down. Hold lithium. Encourage po fluids as pt will not keep in IV ?  ?Recent onset seizure disorder: continue on gabapentin, lamictal. Fall & seizure precautions  ? ?Nausea and vomiting: etiology unclear. Zofran prn. ? ?Likely AKI: Cr is trending up from day prior. Encourage po intake as pt will not keep in IV  ? ?Bipolar 2 disorder: continue on home dose of risperdal, fluoxetine, symmetrel. Continue to hold lithium  ?  ?ADHD: continue on home dose of clonidine  ?  ?DM1: likely poorly controlled. Continue on SSI w/ accuchecks  ?  ?Autism: continue w/ supportive care ? ? ? ?DVT prophylaxis: lovenox  ?Code Status: full ?Family  Communication: called pt's mother, Trula Ore, no answer so I left a message  ?Disposition Plan: likely d/c back home  ? ?Level of care: Progressive ? ?Status is: Observation ?The patient remains OBS appropriate and will d/c before 2 midnights. ? ? ?Consultants:  ? ? ?Procedures: ? ?Antimicrobials:  ? ? ?Subjective: ?Pt denies any complaints  ? ?Objective: ?Vitals:  ? 09/12/21 2246 09/13/21 0447 09/13/21 0719 09/13/21 0730  ?BP: (!) 137/100 (!) 108/56 (!) 103/53 (!) 109/58  ?Pulse: 68 80 68 66  ?Resp: 18 15 16    ?Temp:      ?TempSrc:      ?SpO2: 98% 98% 99% 99%  ?Weight:      ?Height:      ? ?No intake or output data in the 24 hours ending 09/13/21 0813 ?Filed Weights  ? 09/12/21 1844  ?Weight: 68 kg  ? ? ?Examination: ? ?General exam: Appears calm and comfortable  ?Respiratory system: Clear to auscultation. Respiratory effort normal. ?Cardiovascular system: S1 & S2 +. No rubs, gallops or clicks.  ?Gastrointestinal system: Abdomen is nondistended, soft and nontender.  Normal bowel sounds heard. ?Central nervous system: Alert and awake. Moves all extremities  ?Psychiatry: Judgement and insight appears poor. Flat mood and affect  ? ? ? ?Data Reviewed: I have personally reviewed following labs and imaging studies ? ?CBC: ?Recent Labs  ?Lab 09/12/21 ?1122 09/12/21 ?2224  ?WBC 7.8 11.0*  ?NEUTROABS 5.8  --   ?HGB 11.1* 11.4*  ?HCT 34.7* 34.9*  ?MCV 90.6 89.0  ?PLT 198 232  ? ?Basic  Metabolic Panel: ?Recent Labs  ?Lab 09/12/21 ?1122 09/12/21 ?2224  ?NA 137 143  ?K 4.1 3.6  ?CL 103 105  ?CO2 24 28  ?GLUCOSE 103* 108*  ?BUN 12 14  ?CREATININE 1.41* 1.53*  ?CALCIUM 9.9 10.0  ? ?GFR: ?Estimated Creatinine Clearance: 73.5 mL/min (A) (by C-G formula based on SCr of 1.53 mg/dL (H)). ?Liver Function Tests: ?Recent Labs  ?Lab 09/12/21 ?2224  ?AST 18  ?ALT 11  ?ALKPHOS 107  ?BILITOT 1.0  ?PROT 7.7  ?ALBUMIN 4.7  ? ?Recent Labs  ?Lab 09/12/21 ?2224  ?LIPASE 27  ? ?No results for input(s): AMMONIA in the last 168 hours. ?Coagulation  Profile: ?No results for input(s): INR, PROTIME in the last 168 hours. ?Cardiac Enzymes: ?No results for input(s): CKTOTAL, CKMB, CKMBINDEX, TROPONINI in the last 168 hours. ?BNP (last 3 results) ?No results for input(s): PROBNP in the last 8760 hours. ?HbA1C: ?Recent Labs  ?  09/13/21 ?0210  ?HGBA1C 4.7*  ? ?CBG: ?Recent Labs  ?Lab 09/13/21 ?0213 09/13/21 ?0719  ?GLUCAP 170* 152*  ? ?Lipid Profile: ?No results for input(s): CHOL, HDL, LDLCALC, TRIG, CHOLHDL, LDLDIRECT in the last 72 hours. ?Thyroid Function Tests: ?No results for input(s): TSH, T4TOTAL, FREET4, T3FREE, THYROIDAB in the last 72 hours. ?Anemia Panel: ?No results for input(s): VITAMINB12, FOLATE, FERRITIN, TIBC, IRON, RETICCTPCT in the last 72 hours. ?Sepsis Labs: ?No results for input(s): PROCALCITON, LATICACIDVEN in the last 168 hours. ? ?Recent Results (from the past 240 hour(s))  ?Resp Panel by RT-PCR (Flu A&B, Covid) Nasopharyngeal Swab     Status: None  ? Collection Time: 09/12/21 11:36 PM  ? Specimen: Nasopharyngeal Swab; Nasopharyngeal(NP) swabs in vial transport medium  ?Result Value Ref Range Status  ? SARS Coronavirus 2 by RT PCR NEGATIVE NEGATIVE Final  ?  Comment: (NOTE) ?SARS-CoV-2 target nucleic acids are NOT DETECTED. ? ?The SARS-CoV-2 RNA is generally detectable in upper respiratory ?specimens during the acute phase of infection. The lowest ?concentration of SARS-CoV-2 viral copies this assay can detect is ?138 copies/mL. A negative result does not preclude SARS-Cov-2 ?infection and should not be used as the sole basis for treatment or ?other patient management decisions. A negative result may occur with  ?improper specimen collection/handling, submission of specimen other ?than nasopharyngeal swab, presence of viral mutation(s) within the ?areas targeted by this assay, and inadequate number of viral ?copies(<138 copies/mL). A negative result must be combined with ?clinical observations, patient history, and  epidemiological ?information. The expected result is Negative. ? ?Fact Sheet for Patients:  ?BloggerCourse.comhttps://www.fda.gov/media/152166/download ? ?Fact Sheet for Healthcare Providers:  ?SeriousBroker.ithttps://www.fda.gov/media/152162/download ? ?This test is no t yet approved or cleared by the [place] FDA and  ?has been authorized for detection and/or diagnosis of SARS-CoV-2 by ?FDA under an Emergency Use Authorization (EUA). This EUA will remain  ?in effect (meaning this test can be used) for the duration of the ?COVID-19 declaration under Section 564(b)(1) of the Act, 21 ?U.S.C.section 360bbb-3(b)(1), unless the authorization is terminated  ?or revoked sooner.  ? ? ?  ? Influenza A by PCR NEGATIVE NEGATIVE Final  ? Influenza B by PCR NEGATIVE NEGATIVE Final  ?  Comment: (NOTE) ?The Xpert Xpress SARS-CoV-2/FLU/RSV plus assay is intended as an aid ?in the diagnosis of influenza from Nasopharyngeal swab specimens and ?should not be used as a sole basis for treatment. Nasal washings and ?aspirates are unacceptable for Xpert Xpress SARS-CoV-2/FLU/RSV ?testing. ? ?Fact Sheet for Patients: ?BloggerCourse.comhttps://www.fda.gov/media/152166/download ? ?Fact Sheet for Healthcare Providers: ?SeriousBroker.ithttps://www.fda.gov/media/152162/download ? ?This test is  not yet approved or cleared by the [place] and ?has been authorized for detection and/or diagnosis of SARS-CoV-2 by ?FDA under an Emergency Use Authorization (EUA). This EUA will remain ?in effect (meaning this test can be used) for the duration of the ?COVID-19 declaration under Section 564(b)(1) of the Act, 21 U.S.C. ?section 360bbb-3(b)(1), unless the authorization is terminated or ?revoked. ? ?Performed at [facility], [address] [place], ?[place] [address] ?  ?Group A Strep by PCR ([facility] Only)     Status: None  ? Collection Time: 09/12/21 11:36 PM  ? Specimen: Throat; Sterile Swab  ?Result Value Ref Range Status  ? Group A Strep by PCR NOT DETECTED NOT DETECTED Final  ?  Comment:  Performed at [facility], [address] [place], [place] [address]  ?  ? ? ? ? ? ?Radiology Studies: ?CT HEAD WO CONTRAST ( ) ? ?Result Date: 09/12/2021 ?CLINICAL DATA:  Seizure at school, fell and hit left side of face

## 2021-09-13 NOTE — Assessment & Plan Note (Signed)
Evaluated earlier in the emergency room for seizure-like activity ?Neurologist Dr. Leonel Ramsay started him on gabapentin 400 mg every morning and 800 mg every afternoon and Lamictal 25 mg twice daily ?Ativan as needed seizure ?Fall and seizure precautions ?

## 2021-09-13 NOTE — ED Notes (Signed)
Pharmacy called at this time and asked to reschedule semglee that was due at [place], but not verified until 0646. ?

## 2021-09-13 NOTE — ED Notes (Signed)
Floor MD spoke with pt. ?

## 2021-09-13 NOTE — ED Notes (Signed)
RN called 2A for nurse assignment to complete handoff. ?

## 2021-09-13 NOTE — Assessment & Plan Note (Signed)
>>  ASSESSMENT AND PLAN FOR SEIZURE (HCC) WRITTEN ON 09/13/2021  1:42 AM BY CLEATUS HOOF V, MD  Evaluated earlier in the emergency room for seizure-like activity Neurologist Dr. Michaela started him on gabapentin  400 mg every morning and 800 mg every afternoon and Lamictal  25 mg twice daily Ativan  as needed seizure Fall and seizure precautions

## 2021-09-13 NOTE — ED Provider Notes (Signed)
? ?[facility] ?Provider Note ? ? ? Event Date/Time  ? First MD Initiated Contact with Patient 09/12/21 2252   ?  (approximate) ? ? ?History  ? ?Abnormal Lab ? ? ?HPI ? ?Phillip Barajas is a 21 y.o. male with a history of type 1 diabetes, autism, mentally delayed who presents from his group home for an elevated lithium level.  Patient was seen earlier today after having a seizure-like event.  After being discharged with lithium levels came back elevated 1.7 and patient was called back to the ED.  Upon arrival patient is vomiting and complaining of abdominal pain.  History is somewhat limited as every symptom I asked patient responds yes to when including sore throat, abdominal pain, diarrhea, vomiting. ?  ? ? ?Past Medical History:  ?Diagnosis Date  ? Autism   ? Diabetes mellitus without complication ([place])   ? ? ?History reviewed. No pertinent surgical history. ? ? ?Physical Exam  ? ?Triage Vital Signs: ?ED Triage Vitals  ?Enc Vitals Group  ?   BP 09/12/21 1942 139/70  ?   Pulse Rate 09/12/21 1942 72  ?   Resp 09/12/21 1942 18  ?   Temp 09/12/21 1942 97.8 ?F (36.6 ?C)  ?   Temp Source 09/12/21 1942 Oral  ?   SpO2 09/12/21 1942 97 %  ?   Weight 09/12/21 1844 149 lb 14.6 oz (68 kg)  ?   Height 09/12/21 1844 5\' 10"  (1.778 m)  ?   Head Circumference --   ?   Peak Flow --   ?   Pain Score --   ?   Pain Loc --   ?   Pain Edu? --   ?   Excl. in [place]? --   ? ? ?Most recent vital signs: ?Vitals:  ? 09/12/21 2240 09/12/21 2246  ?BP:  (!) 137/100  ?Pulse: 68 68  ?Resp: 18 18  ?Temp:    ?SpO2: 98% 98%  ? ? ? ?Constitutional: Alert and oriented, actively vomiting. ?HEENT: ?     Head: Normocephalic and atraumatic.    ?     Eyes: Conjunctivae are normal. Sclera is non-icteric.  ?     Mouth/Throat: Mucous membranes are moist.  ?     Neck: Supple with no signs of meningismus. ?Cardiovascular: Regular rate and rhythm. No murmurs, gallops, or rubs. 2+ symmetrical distal pulses are present in all extremities.   ?Respiratory: Normal respiratory effort. Lungs are clear to auscultation bilaterally.  ?Gastrointestinal: Soft, mild diffusely tender to palpation, and non distended with positive bowel sounds. No rebound or guarding. ?Genitourinary: No CVA tenderness. ?Musculoskeletal:  No edema, cyanosis, or erythema of extremities. ?Neurologic: Normal speech and language. Face is symmetric. Moving all extremities. No gross focal neurologic deficits are appreciated. ?Skin: Skin is warm, dry and intact. No rash noted. ?Psychiatric: Mood and affect are normal. Speech and behavior are normal. ? ?ED Results / Procedures / Treatments  ? ?Labs ?(all labs ordered are listed, but only abnormal results are displayed) ?Labs Reviewed  ?CBC - Abnormal; Notable for the following components:  ?    Result Value  ? WBC 11.0 (*)   ? RBC 3.92 (*)   ? Hemoglobin 11.4 (*)   ? HCT 34.9 (*)   ? All other components within normal limits  ?COMPREHENSIVE METABOLIC PANEL - Abnormal; Notable for the following components:  ? Glucose, Bld 108 (*)   ? Creatinine, Ser 1.53 (*)   ? All other components  within normal limits  ?LITHIUM LEVEL - Abnormal; Notable for the following components:  ? Lithium Lvl 1.93 (*)   ? All other components within normal limits  ?URINALYSIS, ROUTINE W REFLEX MICROSCOPIC - Abnormal; Notable for the following components:  ? Color, Urine STRAW (*)   ? APPearance CLEAR (*)   ? All other components within normal limits  ?RESP PANEL BY RT-PCR (FLU A&B, COVID) [id number]  ?GROUP A STREP BY PCR  ?LIPASE, BLOOD  ? ? ? ?EKG ? ?ED ECG REPORT ?I, Rudene Re, the attending physician, personally viewed and interpreted this ECG. ? ?Sinus rhythm with a rate of 66, normal intervals, normal axis, no ST elevations or depressions. ? ?RADIOLOGY ?I, Rudene Re, attending MD, have personally viewed and interpreted the images obtained during this visit as below: ? ?T abdomen pelvis with no acute  findings. ? ? ?___________________________________________________ ?Interpretation by Radiologist:  ?[place] (5MM) ? ?Result Date: 09/12/2021 ?CLINICAL DATA:  Seizure at school, fell and hit left side of face EXAM: CT HEAD WITHOUT CONTRAST CT CERVICAL SPINE WITHOUT CONTRAST TECHNIQUE: Multidetector CT imaging of the head and cervical spine was performed following the standard protocol without intravenous contrast. Multiplanar CT image reconstructions of the cervical spine were also generated. RADIATION DOSE REDUCTION: This exam was performed according to the departmental dose-optimization program which includes automated exposure control, adjustment of the mA and/or kV according to patient size and/or use of iterative reconstruction technique. COMPARISON:  08/26/2021 FINDINGS: CT HEAD FINDINGS Brain: No evidence of acute infarction, hemorrhage, hydrocephalus, extra-axial collection or mass lesion/mass effect. Vascular: No hyperdense vessel or unexpected calcification. Skull: Normal. Negative for fracture or focal lesion. Sinuses/Orbits: Mild bilateral maxillary sinus mucosal thickening and a small air-fluid level in the left maxillary sinus (series 4, image 20). No obvious displaced fracture of the sinus walls or orbital floor. Other: None. CT CERVICAL SPINE FINDINGS Alignment: Normal. Skull base and vertebrae: No acute fracture. No primary bone lesion or focal pathologic process. Soft tissues and spinal canal: No prevertebral fluid or swelling. No visible canal hematoma. Disc levels:  Intact. Upper chest: Negative. Other: None. IMPRESSION: 1. No acute intracranial pathology. 2. No fracture or static subluxation of the cervical spine. 3. Mild bilateral maxillary sinus mucosal thickening and a small air-fluid level in the left maxillary sinus. No obvious displaced fracture of the sinus walls or orbital floor. Correlate for acute point tenderness and consider dedicated imaging of the facial bones if there  is clinical concern for fracture. Electronically Signed   By: Delanna Ahmadi M.D.   On: 09/12/2021 12:19  ? ?CT Cervical Spine Wo Contrast ? ?Result Date: 09/12/2021 ?CLINICAL DATA:  Seizure at school, fell and hit left side of face EXAM: CT HEAD WITHOUT CONTRAST CT CERVICAL SPINE WITHOUT CONTRAST TECHNIQUE: Multidetector CT imaging of the head and cervical spine was performed following the standard protocol without intravenous contrast. Multiplanar CT image reconstructions of the cervical spine were also generated. RADIATION DOSE REDUCTION: This exam was performed according to the departmental dose-optimization program which includes automated exposure control, adjustment of the mA and/or kV according to patient size and/or use of iterative reconstruction technique. COMPARISON:  08/26/2021 FINDINGS: CT HEAD FINDINGS Brain: No evidence of acute infarction, hemorrhage, hydrocephalus, extra-axial collection or mass lesion/mass effect. Vascular: No hyperdense vessel or unexpected calcification. Skull: Normal. Negative for fracture or focal lesion. Sinuses/Orbits: Mild bilateral maxillary sinus mucosal thickening and a small air-fluid level in the left maxillary sinus (series 4, image 20). No obvious  displaced fracture of the sinus walls or orbital floor. Other: None. CT CERVICAL SPINE FINDINGS Alignment: Normal. Skull base and vertebrae: No acute fracture. No primary bone lesion or focal pathologic process. Soft tissues and spinal canal: No prevertebral fluid or swelling. No visible canal hematoma. Disc levels:  Intact. Upper chest: Negative. Other: None. IMPRESSION: 1. No acute intracranial pathology. 2. No fracture or static subluxation of the cervical spine. 3. Mild bilateral maxillary sinus mucosal thickening and a small air-fluid level in the left maxillary sinus. No obvious displaced fracture of the sinus walls or orbital floor. Correlate for acute point tenderness and consider dedicated imaging of the facial bones  if there is clinical concern for fracture. Electronically Signed   By: Delanna Ahmadi M.D.   On: 09/12/2021 12:19  ? ?CT ABDOMEN PELVIS W CONTRAST ? ?Result Date: 09/12/2021 ?CLINICAL DATA:  Status post seizure and subsequent fall. EXAM: CT ABDOMEN AND PELVIS W

## 2021-09-13 NOTE — Progress Notes (Signed)
VAST RN spoke to patient's nurse on 1C regarding the ER nurse's note from earlier today which stated if patient tolerated PO's well, IV access could be left out. Unit RN to assess actual need for IV placement at this time. VAST RN will follow up shortly. ?

## 2021-09-13 NOTE — Assessment & Plan Note (Signed)
>>  ASSESSMENT AND PLAN FOR DIABETES MELLITUS WITHOUT COMPLICATION (HCC) WRITTEN ON 09/13/2021  1:42 AM BY DUNCAN, HAZEL V, MD  Sliding scale insulin  coverage

## 2021-09-13 NOTE — Assessment & Plan Note (Addendum)
Increase nursing assistance ?

## 2021-09-13 NOTE — ED Notes (Signed)
RN talked to [facility] on 2A. Per [place], they are not going to be able to take the pt due to no IV and no cardiac monitor. [place] sts that she will reach out to the MD for placement on a different floor. ?

## 2021-09-13 NOTE — Progress Notes (Signed)
Admission profile updated. ?

## 2021-09-13 NOTE — Assessment & Plan Note (Signed)
Except for lithium level, blood work and CT abdomen unrevealing for etiology of nausea and vomiting ?IV hydration, IV antiemetics ?Clear liquid diet advance as tolerated ?

## 2021-09-13 NOTE — Assessment & Plan Note (Signed)
Continue Risperdal, fluoxetine, Symmetrel.  Holding lithium ?

## 2021-09-13 NOTE — ED Notes (Signed)
RN talked to MD about pt removing his IV and monitor. Per MD, have pt do oral fluids as much as he will tolerate. If pt does tolerate PO fluids that IV is not needed. ?

## 2021-09-13 NOTE — ED Notes (Signed)
Water given to pt. Pt consumed 100%. ?

## 2021-09-13 NOTE — Assessment & Plan Note (Signed)
Continue clonidine 

## 2021-09-13 NOTE — Assessment & Plan Note (Signed)
Sliding scale insulin coverage 

## 2021-09-13 NOTE — H&P (Signed)
?History and Physical  ? ? ?Patient: Phillip Barajas [id number] DOB: 2000/08/31 ?DOA: 09/12/2021 ?DOS: the patient was seen and examined on 09/13/2021 ?PCP: Pcp, No  ?Patient coming from:  group home ? ?Chief Complaint:  ?Chief Complaint  ?Patient presents with  ? Abnormal Lab  ? ? ?HPI: Phillip BrookeJoseph Boeh is a 21 y.o. male with medical history significant for Type 1 diabetes, autism, ADHD, developmental delay seen earlier in the ED for seizure-like activity, with neurology consult with Dr. Amada JupiterKirkpatrick recommending increasing gabapentin and starting Lamictal, discharged back to group home who was called back for admission due to an elevated lithium level that returned after his discharge from the emergency room.  On his return to the ED patient was vomiting and was complaining of abdominal pain. ?ED course: Vitals within normal limits.  Blood work with lithium level of 1.93, WBC 11,000, hemoglobin 11.4 and creatinine 1.53.  COVID and flu negative, strep test negative, urinalysis unremarkable, lipase and LFTs WNL.  CT abdomen and pelvis showed no active process. ?Patient started on IV hydration.  Observation admit requested.  ? ?Review of Systems  ?Unable to perform ROS: Psychiatric disorder  ? ?Past Medical History:  ?Diagnosis Date  ? Autism   ? Diabetes mellitus without complication (HCC)   ? ?History reviewed. No pertinent surgical history. ?Social History:  reports that he has never smoked. He has never used smokeless tobacco. He reports that he does not drink alcohol and does not use drugs. ? ?No Known Allergies ? ?No family history on file. ? ?Prior to Admission medications   ?Medication Sig Start Date End Date Taking? Authorizing Provider  ?amantadine (SYMMETREL) 100 MG capsule TAKE 100mg  in the morning, 200mg  at noon, and 100mg  at 7PM. 05/14/18  Yes [provider]  ?cetirizine (ZYRTEC) 10 MG tablet Take 1 tablet by mouth daily. 09/27/20  Yes [provider]  ?cloNIDine (CATAPRES) 0.1 MG tablet  Give 0.1mg  in the morning, 0.05mg  at noon, and 0.1mg  at night 05/14/18  Yes [provider]  ?FLUoxetine (PROZAC) 10 MG capsule Take 10 mg by mouth daily.   Yes [provider]  ?fluticasone (FLONASE) 50 MCG/ACT nasal spray Place 2 sprays into both nostrils daily. 04/26/21  Yes [provider]  ?gabapentin (NEURONTIN) 400 MG capsule Take 400-800 mg by mouth 2 (two) times daily.   Yes [provider]  ?guaifenesin (HUMIBID E) 400 MG TABS tablet Take 400 mg by mouth every 4 (four) hours as needed. 09/27/20  Yes [provider]  ?ibuprofen (ADVIL) 400 MG tablet Take 400 mg by mouth every 8 (eight) hours as needed. 09/27/20  Yes [provider]  ?insulin glargine (LANTUS SOLOSTAR) 100 UNIT/ML Solostar Pen Inject 25 Units into the skin at bedtime. 05/21/18  Yes [provider]  ?insulin lispro (HUMALOG) 100 UNIT/ML injection Inject 6-8 Units into the skin 3 (three) times daily before meals. 6 units qam  ?8 units at lunch and supper   Yes [provider]  ?lamoTRIgine (LAMICTAL) 25 MG tablet Take 1 tablet (25 mg total) by mouth 2 (two) times daily for 14 days. 09/12/21 09/26/21 Yes Gilles ChiquitoSmith, Zachary P, MD  ?lithium carbonate 300 MG capsule Take 1,200 mg by mouth 2 (two) times daily with a meal. 05/14/18  Yes [provider]  ?LORazepam (ATIVAN) 0.5 MG tablet Take 0.5 mg by mouth every 8 (eight) hours as needed. 05/10/21  Yes [provider]  ?melatonin 5 MG TABS Take 10 mg by mouth at bedtime. 09/27/20  Yes [provider]  ?metFORMIN (GLUCOPHAGE) 500 MG tablet Take 500 mg by mouth 2 (two) times daily with a meal. 01/25/21  Yes [provider]  ?risperiDONE (RISPERDAL) 2 MG tablet Take 2 mg by mouth in the morning, at noon, and at bedtime. 05/14/18  Yes [provider]  ?senna (SENOKOT) 8.6 MG tablet Take as needed for constipation 04/26/21  Yes [provider]  ?sodium chloride (OCEAN) 0.65 % nasal spray  instill ONE SPRAY IN EACH NOSTRIL AS NEEDED FOR nasal congestion 03/12/19  Yes [provider]  ?diazepam (VALIUM) 5 MG tablet Give 1 tab 1 hour before vaccine or blood draw ?Patient not taking: Reported on 09/12/2021 07/15/18   [provider]  ? ? ?Physical Exam: ?Vitals:  ? 09/12/21 1844 09/12/21 1942 09/12/21 2240 09/12/21 2246  ?BP:  139/70  (!) 137/100  ?Pulse:  72 68 68  ?Resp:  18 18 18   ?Temp:  97.8 ?F (36.6 ?C)    ?TempSrc:  Oral    ?SpO2:  97% 98% 98%  ?Weight: 68 kg     ?Height: 5\' 10"  (1.778 m)     ? ?Physical Exam ?Vitals and nursing note reviewed.  ?Constitutional:   ?   General: He is not in acute distress. ?HENT:  ?   Head: Normocephalic and atraumatic.  ?Cardiovascular:  ?   Rate and Rhythm: Normal rate and regular rhythm.  ?   Pulses: Normal pulses.  ?   Heart sounds: Normal heart sounds.  ?Pulmonary:  ?   Effort: Pulmonary effort is normal.  ?   Breath sounds: Normal breath sounds.  ?Abdominal:  ?   Palpations: Abdomen is soft.  ?   Tenderness: There is no abdominal tenderness.  ?Neurological:  ?   Mental Status: Mental status is at baseline.  ? ? ? ?Data Reviewed: ?Relevant notes from primary care and specialist visits, past discharge summaries as available in EHR, including Care Everywhere. ?Prior diagnostic testing as pertinent to current admission diagnoses ?Updated medications and problem lists for reconciliation ?ED course, including vitals, labs, imaging, treatment and response to treatment ?Triage notes, nursing and pharmacy notes and ED provider's notes ?Notable results as noted in HPI ? ? ?Assessment and Plan: ?* Lithium toxicity ?Possibly incidental versus symptomatic for nausea and vomiting ?IV hydration and monitor ?Hold lithium ? ?Recent onset seizure disorder (HCC) ?Evaluated earlier in the emergency room for seizure-like activity ?Neurologist Dr. started him on gabapentin 400 mg every morning and 800 mg every afternoon and Lamictal 25 mg twice  daily ?Ativan as needed seizure ?Fall and seizure precautions ? ?Nausea and vomiting ?Except for lithium level, blood work and CT abdomen unrevealing for etiology of nausea and vomiting ?IV hydration, IV antiemetics ?Clear liquid diet advance as tolerated ? ?Bipolar 2 disorder (HCC) ?Continue Risperdal, fluoxetine, Symmetrel.  Holding lithium ? ?ADHD ?Continue clonidine ? ?Diabetes mellitus without complication (HCC) ?Sliding scale insulin coverage ? ?Autism ?Increase nursing assistance ? ? ? ? ? ? ?Advance Care Planning:   Code Status: Not on file full ? ?Consults: none ? ?Family Communication: none ? ?Severity of Illness: ?The appropriate patient status for this patient is OBSERVATION. Observation status is judged to be reasonable and necessary in order to provide the required intensity of service to ensure the patient's safety. The patient's presenting symptoms, physical exam findings, and initial radiographic and laboratory data in the context of their medical condition is felt to place them at decreased risk for further clinical deterioration. Furthermore,  it is anticipated that the patient will be medically stable for discharge from the hospital within 2 midnights of admission.  ? ?Author: ?Andris Baumann, MD ?09/13/2021 1:38 AM ? ?For on call review [email or web address].  ?

## 2021-09-13 NOTE — Assessment & Plan Note (Signed)
Possibly incidental versus symptomatic for nausea and vomiting ?IV hydration and monitor ?Hold lithium ?

## 2021-09-14 DIAGNOSIS — R569 Unspecified convulsions: Secondary | ICD-10-CM | POA: Diagnosis not present

## 2021-09-14 DIAGNOSIS — F840 Autistic disorder: Secondary | ICD-10-CM | POA: Diagnosis not present

## 2021-09-14 DIAGNOSIS — T56891A Toxic effect of other metals, accidental (unintentional), initial encounter: Secondary | ICD-10-CM | POA: Diagnosis not present

## 2021-09-14 LAB — BASIC METABOLIC PANEL
Anion gap: 8 (ref 5–15)
BUN: 11 mg/dL (ref 6–20)
CO2: 27 mmol/L (ref 22–32)
Calcium: 9.6 mg/dL (ref 8.9–10.3)
Chloride: 101 mmol/L (ref 98–111)
Creatinine, Ser: 1.53 mg/dL — ABNORMAL HIGH (ref 0.61–1.24)
GFR, Estimated: >60 mL/min (ref >60)
Glucose, Bld: 178 mg/dL — ABNORMAL HIGH (ref 70–99)
Potassium: 3.6 mmol/L (ref 3.5–5.1)
Sodium: 136 mmol/L (ref 135–145)

## 2021-09-14 LAB — CBC
HCT: 31.7 % — ABNORMAL LOW (ref 39.0–52.0)
Hemoglobin: 10.8 g/dL — ABNORMAL LOW (ref 13.0–17.0)
MCH: 29.3 pg (ref 26.0–34.0)
MCHC: 34.1 g/dL (ref 30.0–36.0)
MCV: 86.1 fL (ref 80.0–100.0)
Platelets: 193 10*3/uL (ref 150–400)
RBC: 3.68 MIL/uL — ABNORMAL LOW (ref 4.22–5.81)
RDW: 13.6 % (ref 11.5–15.5)
WBC: 8.9 10*3/uL (ref 4.0–10.5)
nRBC: 0 % (ref 0.0–0.2)

## 2021-09-14 LAB — GLUCOSE, CAPILLARY
Glucose-Capillary: 122 mg/dL — ABNORMAL HIGH (ref 70–99)
Glucose-Capillary: 163 mg/dL — ABNORMAL HIGH (ref 70–99)

## 2021-09-14 LAB — LITHIUM LEVEL: Lithium Lvl: 1.06 mmol/L (ref 0.60–1.20)

## 2021-09-14 MED ORDER — GABAPENTIN 400 MG PO CAPS: 800.0000 mg | ORAL_CAPSULE | Freq: Every day | ORAL | Status: DC

## 2021-09-14 MED ORDER — GABAPENTIN 400 MG PO CAPS: 400.0000 mg | ORAL_CAPSULE | Freq: Every morning | ORAL | Status: AC

## 2021-09-14 NOTE — Discharge Summary (Signed)
Physician Discharge Summary  ?Phillip Barajas [id number] DOB: Feb 18, 2001 DOA: 09/12/2021 ? ?PCP: Pcp, No ? ?Admit date: 09/12/2021 ?Discharge date: 09/14/2021 ? ?Admitted From: home ?Disposition:  home ? ?Recommendations for Outpatient Follow-up:  ?Follow up with PCP in 1-2 weeks ?F/u w/ neuro in 1-2 weeks ?Can check lithium level monthly  ? ?Home Health: no  ?Equipment/Devices: ? ?Discharge Condition: stable  ?CODE STATUS: full  ?Diet recommendation:  Carb Modified  ? ?Brief/Interim Summary: ?HPI was taken from Dr. Para March: ?Phillip Barajas is a 21 y.o. male with medical history significant for Type 1 diabetes, autism, ADHD, developmental delay seen earlier in the ED for seizure-like activity, with neurology consult with Dr. Amada Jupiter recommending increasing gabapentin and starting Lamictal, discharged back to group home who was called back for admission due to an elevated lithium level that returned after his discharge from the emergency room.  On his return to the ED patient was vomiting and was complaining of abdominal pain. ?ED course: Vitals within normal limits.  Blood work with lithium level of 1.93, WBC 11,000, hemoglobin 11.4 and creatinine 1.53.  COVID and flu negative, strep test negative, urinalysis unremarkable, lipase and LFTs WNL.  CT abdomen and pelvis showed no active process. ?Patient started on IV hydration.  Observation admit requested.  ? ?As per Dr. Mayford Knife 3/28-3/29/23 ?Pt was found to have an elevated lithium level on admission and it was back WNL prior to d/c. Of note, pt did received some IVFs while here for AKI but unfortunately pt pulled out his IVs prior to d/c. Cr was stable at the time of d/c. Pt was ambulating and transferring independently so no therapy was indicated. No further seizures were noted on this admission.  ? ?Discharge Diagnoses:  ?Principal Problem: ?  Lithium toxicity ?Active Problems: ?  Nausea and vomiting ?  Recent onset seizure disorder (HCC) ?  Autism ?  Diabetes  mellitus without complication (HCC) ?  ADHD ?  Bipolar 2 disorder (HCC) ? ?Lithium toxicity: possibly incidental. Lithium is WNL and pt can restart home dose of lithium. Resolved  ?  ?Recent onset seizure disorder: continue on gabapentin, lamictal. Fall & seizure precautions  ? ?Nausea and vomiting: etiology unclear. Zofran prn. Resolved  ? ?Likely AKI: Cr is stable  ? ?Bipolar 2 disorder: continue on home dose of risperdal, fluoxetine, symmetrel & lithium  ?  ?ADHD: continue on home dose of clonidine  ?  ?DM1: likely poorly controlled. Continue on SSI w/ accuchecks  ?  ?Autism: continue w/ supportive care ? ?Discharge Instructions ? ?Discharge Instructions   ? ? Diet Carb Modified   Complete by: As directed ?  ? Discharge instructions   Complete by: As directed ?  ? F/u w/ PCP in 1-2 weeks. F/u w/ neurology in 1-2 weeks  ? Increase activity slowly   Complete by: As directed ?  ? ?  ? ?Allergies as of 09/14/2021   ?No Known Allergies ?  ? ?  ?Medication List  ?  ? ?STOP taking these medications   ? ?diazepam 5 MG tablet ?Commonly known as: VALIUM ?  ? ?  ? ?TAKE these medications   ? ?amantadine 100 MG capsule ?Commonly known as: SYMMETREL ?TAKE 100mg  in the morning, 200mg  at noon, and 100mg  at 7PM. ?  ?cetirizine 10 MG tablet ?Commonly known as: ZYRTEC ?Take 1 tablet by mouth daily. ?  ?cloNIDine 0.1 MG tablet ?Commonly known as: CATAPRES ?Give 0.1mg  in the morning, 0.05mg  at noon, and 0.1mg  at night ?  ?  FLUoxetine 10 MG capsule ?Commonly known as: PROZAC ?Take 10 mg by mouth daily. ?  ?fluticasone 50 MCG/ACT nasal spray ?Commonly known as: FLONASE ?Place 2 sprays into both nostrils daily. ?  ?gabapentin 400 MG capsule ?Commonly known as: NEURONTIN ?Take 2 capsules (800 mg total) by mouth at bedtime. ?What changed:  ?how much to take ?when to take this ?  ?gabapentin 400 MG capsule ?Commonly known as: NEURONTIN ?Take 1 capsule (400 mg total) by mouth every morning. ?Start taking on: September 15, 2021 ?What changed: You  were already taking a medication with the same name, and this prescription was added. Make sure you understand how and when to take each. ?  ?guaifenesin 400 MG Tabs tablet ?Commonly known as: HUMIBID E ?Take 400 mg by mouth every 4 (four) hours as needed. ?  ?ibuprofen 400 MG tablet ?Commonly known as: ADVIL ?Take 400 mg by mouth every 8 (eight) hours as needed. ?  ?insulin lispro 100 UNIT/ML injection ?Commonly known as: HUMALOG ?Inject 6-8 Units into the skin 3 (three) times daily before meals. 6 units qam  ?8 units at lunch and supper ?  ?lamoTRIgine 25 MG tablet ?Commonly known as: LaMICtal ?Take 1 tablet (25 mg total) by mouth 2 (two) times daily for 14 days. ?  ?Lantus SoloStar 100 UNIT/ML Solostar Pen ?Generic drug: insulin glargine ?Inject 25 Units into the skin at bedtime. ?  ?lithium carbonate 300 MG capsule ?Take 1,200 mg by mouth 2 (two) times daily with a meal. ?  ?LORazepam 0.5 MG tablet ?Commonly known as: ATIVAN ?Take 0.5 mg by mouth every 8 (eight) hours as needed. ?  ?melatonin 5 MG Tabs ?Take 10 mg by mouth at bedtime. ?  ?metFORMIN 500 MG tablet ?Commonly known as: GLUCOPHAGE ?Take 500 mg by mouth 2 (two) times daily with a meal. ?  ?risperiDONE 2 MG tablet ?Commonly known as: RISPERDAL ?Take 2 mg by mouth in the morning, at noon, and at bedtime. ?  ?senna 8.6 MG tablet ?Commonly known as: SENOKOT ?Take as needed for constipation ?  ?sodium chloride 0.65 % nasal spray ?Commonly known as: OCEAN ?instill ONE SPRAY IN EACH NOSTRIL AS NEEDED FOR nasal congestion ?  ? ?  ? ? ?No Known Allergies ? ?Consultations: ? ? ? ?Procedures/Studies: ?CT HEAD WO CONTRAST (5MM) ? ?Result Date: 09/12/2021 ?CLINICAL DATA:  Seizure at school, fell and hit left side of face EXAM: CT HEAD WITHOUT CONTRAST CT CERVICAL SPINE WITHOUT CONTRAST TECHNIQUE: Multidetector CT imaging of the head and cervical spine was performed following the standard protocol without intravenous contrast. Multiplanar CT image reconstructions of  the cervical spine were also generated. RADIATION DOSE REDUCTION: This exam was performed according to the departmental dose-optimization program which includes automated exposure control, adjustment of the mA and/or kV according to patient size and/or use of iterative reconstruction technique. COMPARISON:  08/26/2021 FINDINGS: CT HEAD FINDINGS Brain: No evidence of acute infarction, hemorrhage, hydrocephalus, extra-axial collection or mass lesion/mass effect. Vascular: No hyperdense vessel or unexpected calcification. Skull: Normal. Negative for fracture or focal lesion. Sinuses/Orbits: Mild bilateral maxillary sinus mucosal thickening and a small air-fluid level in the left maxillary sinus (series 4, image 20). No obvious displaced fracture of the sinus walls or orbital floor. Other: None. CT CERVICAL SPINE FINDINGS Alignment: Normal. Skull base and vertebrae: No acute fracture. No primary bone lesion or focal pathologic process. Soft tissues and spinal canal: No prevertebral fluid or swelling. No visible canal hematoma. Disc levels:  Intact. Upper chest: Negative.  Other: None. IMPRESSION: 1. No acute intracranial pathology. 2. No fracture or static subluxation of the cervical spine. 3. Mild bilateral maxillary sinus mucosal thickening and a small air-fluid level in the left maxillary sinus. No obvious displaced fracture of the sinus walls or orbital floor. Correlate for acute point tenderness and consider dedicated imaging of the facial bones if there is clinical concern for fracture. Electronically Signed   By: Jearld Lesch M.D.   On: 09/12/2021 12:19  ? ?CT Head Wo Contrast ? ?Result Date: 08/26/2021 ?CLINICAL DATA:  New onset seizure. No history of trauma. Altered mental status. EXAM: CT HEAD WITHOUT CONTRAST TECHNIQUE: Contiguous axial images were obtained from the base of the skull through the vertex without intravenous contrast. RADIATION DOSE REDUCTION: This exam was performed according to the departmental  dose-optimization program which includes automated exposure control, adjustment of the mA and/or kV according to patient size and/or use of iterative reconstruction technique. COMPARISON:  None. FIND

## 2021-09-14 NOTE — TOC Initial Note (Addendum)
Transition of Care (TOC) - Initial/Assessment Note  ? ? ?Patient Details  ?Name: Phillip Barajas ?MRN: [id number] ?Date of Birth: 2000-09-21 ? ?Transition of Care (TOC) CM/SW Contact:    ?Jarryn Altland A Rayshun Kandler, LCSW ?Phone Number: ?09/14/2021, 9:37 AM ? ?Clinical Narrative:  CSW spoke with Randal Buba [phone]) the admin at [facility]. Simonne Maffucci states they can take pt back today. Simonne Maffucci is asking that MD indicate how often pt should be getting labs drawn. Garnette is requesting dc summary be sent with dc ppwk. Per Josefa Half she is going to talk to facility and coordinate a time for them to pick pt up-- she will follow up with CSW. MD updated.               ? ? ?Expected Discharge Plan: Group Home ?Barriers to Discharge: Continued Medical Work up ? ? ?Patient Goals and CMS Choice ?  ?  ?  ? ?Expected Discharge Plan and Services ?Expected Discharge Plan: Group Home ?In-house Referral: Clinical Social Work ?  ?Post Acute Care Choice:  (group home) ?Living arrangements for the past 2 months: Group Home ?                ?  ?  ?  ?  ?  ?  ?  ?  ?  ?  ? ?Prior Living Arrangements/Services ?Living arrangements for the past 2 months: Group Home ?Lives with:: Facility Resident ?Patient language and need for interpreter reviewed:: Yes ?       ?Need for Family Participation in Patient Care: Yes (Comment) ?Care giver support system in place?: Yes (comment) ?  ?Criminal Activity/Legal Involvement Pertinent to Current Situation/Hospitalization: No - Comment as needed ? ?Activities of Daily Living ?Home Assistive Devices/Equipment: None ?ADL Screening (condition at time of admission) ?Patient's cognitive ability adequate to safely complete daily activities?: Yes ?Is the patient deaf or have difficulty hearing?: No ?Does the patient have difficulty seeing, even when wearing glasses/contacts?: No ?Does the patient have difficulty concentrating, remembering, or making decisions?: No ?Patient able to express need for assistance  with ADLs?: Yes ?Does the patient have difficulty dressing or bathing?: No ?Independently performs ADLs?: Yes (appropriate for developmental age) ?Does the patient have difficulty walking or climbing stairs?: No ?Weakness of Legs: None ?Weakness of Arms/Hands: None ? ?Permission Sought/Granted ?  ?  ?   ?   ?   ?   ? ?Emotional Assessment ?Appearance:: Appears stated age ?Attitude/Demeanor/Rapport: Unable to Assess ?Affect (typically observed): Unable to Assess ?Orientation: : Oriented to Self ?Alcohol / Substance Use: Not Applicable ?Psych Involvement: No (comment) ? ?Admission diagnosis:  Lithium toxicity [T56.891A] ?Lithium overdose, accidental or unintentional, initial encounter [T56.891A] ?Elevated lithium level [R79.89] ?Patient Active Problem List  ? Diagnosis Date Noted  ? Nausea and vomiting 09/13/2021  ? Lithium toxicity 09/13/2021  ? Autism   ? Diabetes mellitus without complication ([place])   ? Recent onset seizure disorder ([place])   ? ADHD   ? Bipolar 2 disorder ([place]) 09/27/2020  ? ?PCP:  Pcp, No ?Pharmacy:  No Pharmacies Listed ? ? ? ?Social Determinants of Health (SDOH) Interventions ?  ? ?Readmission Risk Interventions ?   ? View : No data to display.  ?  ?  ?  ? ? ? ?

## 2021-09-19 ENCOUNTER — Other Ambulatory Visit: Payer: Self-pay

## 2021-09-19 ENCOUNTER — Emergency Department
Admission: EM | Admit: 2021-09-19 | Discharge: 2021-09-19 | Disposition: A | Discharge status: Home / Self Care | Payer: Medicaid Other | Attending: Emergency Medicine | Admitting: Emergency Medicine

## 2021-09-19 DIAGNOSIS — L739 Follicular disorder, unspecified: Secondary | ICD-10-CM | POA: Diagnosis present

## 2021-09-19 MED ORDER — MUPIROCIN 2 % EX OINT
1.0000 | TOPICAL_OINTMENT | Freq: Two times a day (BID) | CUTANEOUS | 0 refills | Status: AC
Start: 2021-09-19 — End: 2021-09-26

## 2021-09-19 MED ORDER — DOXYCYCLINE MONOHYDRATE 100 MG PO TABS: 100.0000 mg | ORAL_TABLET | Freq: Two times a day (BID) | ORAL | 0 refills | Status: AC

## 2021-09-19 NOTE — Discharge Instructions (Addendum)
Apply mupirocin twice daily for 7 days. ?Take doxycycline twice daily for 7 days. ?

## 2021-09-19 NOTE — ED Provider Notes (Signed)
? ?[facility] ?Provider Note ? ?Patient Contact: 5:07 PM (approximate) ? ? ?History  ? ?Abscess ? ? ?HPI ? ?Phillip Barajas is a 21 y.o. male accompanied by group home staff presents to the emergency department with a region of folliculitis along right cheek.  Group home caretaker states that patient frequently touches the floor and other objects and then touches his face.  No fever or chills at home. ? ?  ? ? ?Physical Exam  ? ?Triage Vital Signs: ?ED Triage Vitals [09/19/21 1300]  ?Enc Vitals Group  ?   BP 131/78  ?   Pulse Rate 68  ?   Resp 18  ?   Temp 98 ?F (36.7 ?C)  ?   Temp Source Oral  ?   SpO2 97 %  ?   Weight 160 lb (72.6 kg)  ?   Height   ?   Head Circumference   ?   Peak Flow   ?   Pain Score   ?   Pain Loc   ?   Pain Edu?   ?   Excl. in GC?   ? ? ?Most recent vital signs: ?Vitals:  ? 09/19/21 1300  ?BP: 131/78  ?Pulse: 68  ?Resp: 18  ?Temp: 98 ?F (36.7 ?C)  ?SpO2: 97%  ? ? ? ?General: Alert and in no acute distress. ?Eyes:  PERRL. EOMI. ?{Head: No acute traumatic findings ?ENT: ?     Ears:  ?     Nose: No congestion/rhinnorhea. ?     Mouth/Throat: Mucous membranes are moist.  ?Neck: No stridor. No cervical spine tenderness to palpation. ?Cardiovascular:  Good peripheral perfusion ?Respiratory: Normal respiratory effort without tachypnea or retractions. Lungs CTAB. Good air entry to the bases with no decreased or absent breath sounds. ?Gastrointestinal: Bowel sounds ?4 quadrants. Soft and nontender to palpation. No guarding or rigidity. No palpable masses. No distention. No CVA tenderness. ?Musculoskeletal: Full range of motion to all extremities.  ?Neurologic:  No gross focal neurologic deficits are appreciated.  ?Skin: Patient has a 1 cm x 1 cm region of folliculitis along right cheek. ? ? ? ?ED Results / Procedures / Treatments  ? ?Labs ?(all labs ordered are listed, but only abnormal results are displayed) ?Labs Reviewed - No data to display ? ? ? ? ?PROCEDURES: ? ?Critical Care  performed: No ? ?Procedures ? ? ?MEDICATIONS ORDERED IN ED: ?Medications - No data to display ? ? ?IMPRESSION / MDM / ASSESSMENT AND PLAN / ED COURSE  ?I reviewed the triage vital signs and the nursing notes. ?             ?               ? ?Assessment and plan ?Folliculitis ? ?Differential diagnosis includes, but is not limited to, folliculitis ?21 year old male presents to the emergency department with a 1 cm x 1 cm region of folliculitis along right cheek.  We will treat with doxycycline and topical mupirocin while patient follow-up with primary care as needed. ? ?  ? ? ?FINAL CLINICAL IMPRESSION(S) / ED DIAGNOSES  ? ?Final diagnoses:  ?Folliculitis  ? ? ? ?Rx / DC Orders  ? ?ED Discharge Orders   ? ?      Ordered  ?  doxycycline (ADOXA) 100 MG tablet  2 times daily       ? 09/19/21 1705  ?  mupirocin ointment (BACTROBAN) 2 %  2 times daily       ?  09/19/21 1705  ? ?  ?  ? ?  ? ? ? ?Note:  This document was prepared using Dragon voice recognition software and may include unintentional dictation errors. ?  ?Orvil Feil, PA-C ?09/19/21 1709 ? ?  Sharyn Creamer, MD ?09/19/21 2033 ? ?

## 2021-09-19 NOTE — ED Triage Notes (Signed)
Pt has an abscess to the right cheek for the past 2-3 days, here with care giver from care home ?

## 2021-10-19 ENCOUNTER — Emergency Department: Payer: Medicaid Other

## 2021-10-19 ENCOUNTER — Observation Stay
Admission: EM | Admit: 2021-10-19 | Discharge: 2021-10-20 | Disposition: A | Discharge status: Home / Self Care | Payer: Medicaid Other | Attending: Internal Medicine | Admitting: Internal Medicine

## 2021-10-19 ENCOUNTER — Observation Stay: Payer: Medicaid Other

## 2021-10-19 DIAGNOSIS — R569 Unspecified convulsions: Secondary | ICD-10-CM

## 2021-10-19 DIAGNOSIS — F840 Autistic disorder: Secondary | ICD-10-CM | POA: Insufficient documentation

## 2021-10-19 DIAGNOSIS — Z7984 Long term (current) use of oral hypoglycemic drugs: Secondary | ICD-10-CM | POA: Insufficient documentation

## 2021-10-19 DIAGNOSIS — F3181 Bipolar II disorder: Secondary | ICD-10-CM | POA: Diagnosis not present

## 2021-10-19 DIAGNOSIS — Z79899 Other long term (current) drug therapy: Secondary | ICD-10-CM | POA: Insufficient documentation

## 2021-10-19 DIAGNOSIS — E10649 Type 1 diabetes mellitus with hypoglycemia without coma: Principal | ICD-10-CM | POA: Diagnosis present

## 2021-10-19 DIAGNOSIS — Z794 Long term (current) use of insulin: Secondary | ICD-10-CM | POA: Insufficient documentation

## 2021-10-19 DIAGNOSIS — E10641 Type 1 diabetes mellitus with hypoglycemia with coma: Secondary | ICD-10-CM

## 2021-10-19 DIAGNOSIS — G40909 Epilepsy, unspecified, not intractable, without status epilepticus: Secondary | ICD-10-CM

## 2021-10-19 LAB — CBG MONITORING, ED
Glucose-Capillary: 124 mg/dL — ABNORMAL HIGH (ref 70–99)
Glucose-Capillary: 32 mg/dL — CL (ref 70–99)
Glucose-Capillary: 61 mg/dL — ABNORMAL LOW (ref 70–99)
Glucose-Capillary: 118 mg/dL — ABNORMAL HIGH (ref 70–99)
Glucose-Capillary: 75 mg/dL (ref 70–99)
Glucose-Capillary: 101 mg/dL — ABNORMAL HIGH (ref 70–99)
Glucose-Capillary: 49 mg/dL — ABNORMAL LOW (ref 70–99)
Glucose-Capillary: 44 mg/dL — CL (ref 70–99)
Glucose-Capillary: 122 mg/dL — ABNORMAL HIGH (ref 70–99)
Glucose-Capillary: 156 mg/dL — ABNORMAL HIGH (ref 70–99)
Glucose-Capillary: 115 mg/dL — ABNORMAL HIGH (ref 70–99)

## 2021-10-19 LAB — URINALYSIS, ROUTINE W REFLEX MICROSCOPIC
Bilirubin Urine: NEGATIVE
Glucose, UA: NEGATIVE mg/dL
Hgb urine dipstick: NEGATIVE
Ketones, ur: NEGATIVE mg/dL
Leukocytes,Ua: NEGATIVE
Nitrite: NEGATIVE
Protein, ur: NEGATIVE mg/dL
Specific Gravity, Urine: 1.003 — ABNORMAL LOW (ref 1.005–1.030)
pH: 6 (ref 5.0–8.0)

## 2021-10-19 LAB — COMPREHENSIVE METABOLIC PANEL
ALT: 9 U/L (ref 0–44)
AST: 21 U/L (ref 15–41)
Albumin: 4 g/dL (ref 3.5–5.0)
Alkaline Phosphatase: 84 U/L (ref 38–126)
Anion gap: 8 (ref 5–15)
BUN: 13 mg/dL (ref 6–20)
CO2: 25 mmol/L (ref 22–32)
Calcium: 9.2 mg/dL (ref 8.9–10.3)
Chloride: 103 mmol/L (ref 98–111)
Creatinine, Ser: 1.32 mg/dL — ABNORMAL HIGH (ref 0.61–1.24)
GFR, Estimated: >60 mL/min (ref >60)
Glucose, Bld: 258 mg/dL — ABNORMAL HIGH (ref 70–99)
Potassium: 3.6 mmol/L (ref 3.5–5.1)
Sodium: 136 mmol/L (ref 135–145)
Total Bilirubin: 0.7 mg/dL (ref 0.3–1.2)
Total Protein: 6.6 g/dL (ref 6.5–8.1)

## 2021-10-19 LAB — CBC WITH DIFFERENTIAL/PLATELET
Abs Immature Granulocytes: 0.02 10*3/uL (ref 0.00–0.07)
Basophils Absolute: 0 10*3/uL (ref 0.0–0.1)
Basophils Relative: 0 %
Eosinophils Absolute: 0.2 10*3/uL (ref 0.0–0.5)
Eosinophils Relative: 2 %
HCT: 32.2 % — ABNORMAL LOW (ref 39.0–52.0)
Hemoglobin: 10.6 g/dL — ABNORMAL LOW (ref 13.0–17.0)
Immature Granulocytes: 0 %
Lymphocytes Relative: 17 %
Lymphs Abs: 1.3 10*3/uL (ref 0.7–4.0)
MCH: 28.9 pg (ref 26.0–34.0)
MCHC: 32.9 g/dL (ref 30.0–36.0)
MCV: 87.7 fL (ref 80.0–100.0)
Monocytes Absolute: 0.4 10*3/uL (ref 0.1–1.0)
Monocytes Relative: 6 %
Neutro Abs: 5.5 10*3/uL (ref 1.7–7.7)
Neutrophils Relative %: 75 %
Platelets: 154 10*3/uL (ref 150–400)
RBC: 3.67 MIL/uL — ABNORMAL LOW (ref 4.22–5.81)
RDW: 14.1 % (ref 11.5–15.5)
WBC: 7.5 10*3/uL (ref 4.0–10.5)
nRBC: 0 % (ref 0.0–0.2)

## 2021-10-19 MED ORDER — DEXTROSE 50 % IV SOLN
INTRAVENOUS | Status: AC
  Administered 2021-10-19: 50 mL via INTRAVENOUS
  Filled 2021-10-19: qty 50

## 2021-10-19 MED ORDER — SODIUM CHLORIDE 0.9 % IV SOLN: 2000.0000 mg | Freq: Once | INTRAVENOUS | Status: DC

## 2021-10-19 MED ORDER — LORAZEPAM 2 MG/ML IJ SOLN: 1.0000 mg | Freq: Once | INTRAMUSCULAR | Status: AC

## 2021-10-19 MED ORDER — DEXTROSE 50 % IV SOLN: 1.0000 | Freq: Once | INTRAVENOUS | Status: AC

## 2021-10-19 MED ORDER — LEVETIRACETAM IN NACL 1000 MG/100ML IV SOLN
1000.0000 mg | Freq: Once | INTRAVENOUS | Status: AC
  Administered 2021-10-19: 1000 mg via INTRAVENOUS
  Filled 2021-10-19: qty 100

## 2021-10-19 MED ORDER — DEXTROSE 50 % IV SOLN
12.5000 g | Freq: Once | INTRAVENOUS | Status: AC
  Administered 2021-10-19: 12.5 g via INTRAVENOUS

## 2021-10-19 MED ORDER — LORAZEPAM 2 MG/ML IJ SOLN
INTRAMUSCULAR | Status: AC
  Administered 2021-10-19: 1 mg via INTRAMUSCULAR
  Filled 2021-10-19: qty 1

## 2021-10-19 MED ORDER — DEXTROSE 50 % IV SOLN
1.0000 | Freq: Once | INTRAVENOUS | Status: AC
  Administered 2021-10-19: 50 mL via INTRAVENOUS

## 2021-10-19 MED ORDER — ONDANSETRON HCL 4 MG PO TABS: 4.0000 mg | ORAL_TABLET | Freq: Four times a day (QID) | ORAL | Status: DC | PRN

## 2021-10-19 MED ORDER — DEXTROSE 10 % IV SOLN: INTRAVENOUS | Status: DC

## 2021-10-19 MED ORDER — LEVETIRACETAM IN NACL 500 MG/100ML IV SOLN
500.0000 mg | Freq: Two times a day (BID) | INTRAVENOUS | Status: DC
Start: 2021-10-19 — End: 2021-10-20
  Administered 2021-10-19 – 2021-10-20 (×2): 500 mg via INTRAVENOUS
  Filled 2021-10-19 (×2): qty 100

## 2021-10-19 MED ORDER — ONDANSETRON HCL 4 MG/2ML IJ SOLN
4.0000 mg | Freq: Four times a day (QID) | INTRAMUSCULAR | Status: DC | PRN
  Administered 2021-10-20: 4 mg via INTRAVENOUS
  Filled 2021-10-19: qty 2

## 2021-10-19 MED ORDER — ENOXAPARIN SODIUM 40 MG/0.4ML IJ SOSY
40.0000 mg | PREFILLED_SYRINGE | INTRAMUSCULAR | Status: DC
  Administered 2021-10-19: 40 mg via SUBCUTANEOUS
  Filled 2021-10-19: qty 0.4

## 2021-10-19 MED ORDER — DEXTROSE 50 % IV SOLN
INTRAVENOUS | Status: AC
Start: 2021-10-19 — End: 2021-10-19
  Administered 2021-10-19: 50 mL via INTRAVENOUS
  Filled 2021-10-19: qty 50

## 2021-10-19 MED ORDER — ACETAMINOPHEN 325 MG PO TABS: 650.0000 mg | ORAL_TABLET | Freq: Four times a day (QID) | ORAL | Status: DC | PRN

## 2021-10-19 MED ORDER — DEXTROSE 50 % IV SOLN
INTRAVENOUS | Status: AC
  Filled 2021-10-19: qty 50

## 2021-10-19 MED ORDER — ACETAMINOPHEN 325 MG RE SUPP: 650.0000 mg | Freq: Four times a day (QID) | RECTAL | Status: DC | PRN

## 2021-10-19 MED ORDER — LORAZEPAM 2 MG/ML IJ SOLN: 2.0000 mg | INTRAMUSCULAR | Status: DC | PRN

## 2021-10-19 NOTE — ED Notes (Signed)
CBG 32. 

## 2021-10-19 NOTE — ED Triage Notes (Signed)
Pt BIB [facility] from [facility] s/p seizure at school reportedly lasting 8 minutes. Per EMS, school administered insulin around lunch time and pt has not eaten. CBG 91 per EMS. ? ? ?Past Medical History:  ?Diagnosis Date  ? Autism   ? Diabetes mellitus without complication ([place])   ? ? ?

## 2021-10-19 NOTE — ED Notes (Signed)
Patient in bed resting with eyes closed. Patient caretaker from group home left bedside and replaced with [facility] tech/sitter. Patient reports no complaints at this time ?

## 2021-10-19 NOTE — ED Provider Notes (Signed)
? ?[facility] ?Provider Note ? ? Event Date/Time  ? First MD Initiated Contact with Patient 10/19/21 1340   ?  (approximate) ?History  ?Seizures ? ?HPI ?Phillip Barajas is a 21 y.o. male with a past medical history of autism and recently diagnosed epilepsy who presents for seizure activity that occurred at his group home earlier today.  Patient of note was given his insulin without any p.o. intake and subsequently had an episode of seizure-like activity prior to EMS being called.  Staff note seizure activity lasted approximately 8 minutes and patient had prolonged return to baseline.  Patient is awake and asking for food upon my evaluation but is still confused as to where he is.  Unknown baseline ?Physical Exam  ?Triage Vital Signs: ?ED Triage Vitals  ?Enc Vitals Group  ?   BP 10/19/21 1345 113/88  ?   Pulse Rate 10/19/21 1345 68  ?   Resp 10/19/21 1345 14  ?   Temp 10/19/21 1345 (!) 97.5 ?F (36.4 ?C)  ?   Temp Source 10/19/21 1345 Oral  ?   SpO2 10/19/21 1334 100 %  ?   Weight 10/19/21 1337 160 lb (72.6 kg)  ?   Height 10/19/21 1337 5\' 10"  (1.778 m)  ?   Head Circumference --   ?   Peak Flow --   ?   Pain Score --   ?   Pain Loc --   ?   Pain Edu? --   ?   Excl. in GC? --   ? ?Most recent vital signs: ?Vitals:  ? 10/20/21 0700 10/20/21 1010  ?BP: 122/72 119/77  ?Pulse: 86 83  ?Resp: 17 16  ?Temp:    ?SpO2: 100% 100%  ? ?General: Awake, cooperative, following commands ?CV:  Good peripheral perfusion.  ?Resp:  Normal effort.  ?Abd:  No distention.  ?Other:  Young adult Caucasian male sitting in bed in no acute distress ?ED Results / Procedures / Treatments  ?Labs ?(all labs ordered are listed, but only abnormal results are displayed) ?Labs Reviewed  ?COMPREHENSIVE METABOLIC PANEL - Abnormal; Notable for the following components:  ?    Result Value  ? Glucose, Bld 258 (*)   ? Creatinine, Ser 1.32 (*)   ? All other components within normal limits  ?CBC WITH DIFFERENTIAL/PLATELET - Abnormal;  Notable for the following components:  ? RBC 3.67 (*)   ? Hemoglobin 10.6 (*)   ? HCT 32.2 (*)   ? All other components within normal limits  ?URINALYSIS, ROUTINE W REFLEX MICROSCOPIC - Abnormal; Notable for the following components:  ? Color, Urine STRAW (*)   ? APPearance CLEAR (*)   ? Specific Gravity, Urine 1.003 (*)   ? All other components within normal limits  ?BASIC METABOLIC PANEL - Abnormal; Notable for the following components:  ? Glucose, Bld 149 (*)   ? Creatinine, Ser 1.33 (*)   ? All other components within normal limits  ?CBC - Abnormal; Notable for the following components:  ? RBC 3.87 (*)   ? Hemoglobin 11.3 (*)   ? HCT 33.9 (*)   ? All other components within normal limits  ?CBG MONITORING, ED - Abnormal; Notable for the following components:  ? Glucose-Capillary 32 (*)   ? All other components within normal limits  ?CBG MONITORING, ED - Abnormal; Notable for the following components:  ? Glucose-Capillary 124 (*)   ? All other components within normal limits  ?CBG MONITORING, ED - Abnormal; Notable  for the following components:  ? Glucose-Capillary 61 (*)   ? All other components within normal limits  ?CBG MONITORING, ED - Abnormal; Notable for the following components:  ? Glucose-Capillary 118 (*)   ? All other components within normal limits  ?CBG MONITORING, ED - Abnormal; Notable for the following components:  ? Glucose-Capillary 49 (*)   ? All other components within normal limits  ?CBG MONITORING, ED - Abnormal; Notable for the following components:  ? Glucose-Capillary 101 (*)   ? All other components within normal limits  ?CBG MONITORING, ED - Abnormal; Notable for the following components:  ? Glucose-Capillary 44 (*)   ? All other components within normal limits  ?CBG MONITORING, ED - Abnormal; Notable for the following components:  ? Glucose-Capillary 122 (*)   ? All other components within normal limits  ?CBG MONITORING, ED - Abnormal; Notable for the following components:  ?  Glucose-Capillary 156 (*)   ? All other components within normal limits  ?CBG MONITORING, ED - Abnormal; Notable for the following components:  ? Glucose-Capillary 115 (*)   ? All other components within normal limits  ?CBG MONITORING, ED - Abnormal; Notable for the following components:  ? Glucose-Capillary 143 (*)   ? All other components within normal limits  ?CBG MONITORING, ED - Abnormal; Notable for the following components:  ? Glucose-Capillary 222 (*)   ? All other components within normal limits  ?CBG MONITORING, ED - Abnormal; Notable for the following components:  ? Glucose-Capillary 146 (*)   ? All other components within normal limits  ?CBG MONITORING, ED - Abnormal; Notable for the following components:  ? Glucose-Capillary 153 (*)   ? All other components within normal limits  ?LAMOTRIGINE LEVEL  ?CBG MONITORING, ED  ? ?EKG ?ED ECG REPORT ?I, Merwyn Katos, the attending physician, personally viewed and interpreted this ECG. ?Date: 10/19/2021 ?EKG Time: 1342 ?Rate: 72 ?Rhythm: normal sinus rhythm ?QRS Axis: normal ?Intervals: normal ?ST/T Wave abnormalities: normal ?Narrative Interpretation: no evidence of acute ischemia ?RADIOLOGY ?ED MD interpretation: Pending ?-Agree with radiology assessment ?Official radiology report(s): ?CT Head Wo Contrast ? ?Result Date: 10/19/2021 ?CLINICAL DATA:  Seizure. EXAM: CT HEAD WITHOUT CONTRAST TECHNIQUE: Contiguous axial images were obtained from the base of the skull through the vertex without intravenous contrast. RADIATION DOSE REDUCTION: This exam was performed according to the departmental dose-optimization program which includes automated exposure control, adjustment of the mA and/or kV according to patient size and/or use of iterative reconstruction technique. COMPARISON:  September 12, 2021. FINDINGS: Brain: No evidence of acute infarction, hemorrhage, hydrocephalus, extra-axial collection or mass lesion/mass effect. Vascular: No hyperdense vessel or unexpected  calcification. Skull: Normal. Negative for fracture or focal lesion. Sinuses/Orbits: Mild mucosal thickening is seen involving both maxillary and ethmoid sinuses. Other: None. IMPRESSION: No acute intracranial abnormality seen. Electronically Signed   By: Lupita Raider M.D.   On: 10/19/2021 17:48   ?PROCEDURES: ?Critical Care performed: No ?.1-3 Lead EKG Interpretation ?Performed by: Merwyn Katos, MD ?Authorized by: Merwyn Katos, MD  ? ?  Interpretation: normal   ?  ECG rate:  84 ?  ECG rate assessment: normal   ?  Rhythm: sinus rhythm   ?  Ectopy: none   ?  Conduction: normal   ?MEDICATIONS ORDERED IN ED: ?Medications  ?enoxaparin (LOVENOX) injection 40 mg (40 mg Subcutaneous Given 10/19/21 2307)  ?acetaminophen (TYLENOL) tablet 650 mg (has no administration in time range)  ?  Or  ?acetaminophen (TYLENOL) suppository  650 mg (has no administration in time range)  ?ondansetron (ZOFRAN) tablet 4 mg ( Oral See Alternative 10/20/21 1147)  ?  Or  ?ondansetron (ZOFRAN) injection 4 mg (4 mg Intravenous Given 10/20/21 1147)  ?LORazepam (ATIVAN) injection 2 mg (has no administration in time range)  ?levETIRAcetam (KEPPRA) IVPB 500 mg/100 mL premix (0 mg Intravenous Stopped 10/20/21 1125)  ?dextrose 50 % solution 50 mL (50 mLs Intravenous Given 10/19/21 1346)  ?dextrose 50 % solution 50 mL (50 mLs Intravenous Given 10/19/21 1424)  ?levETIRAcetam (KEPPRA) IVPB 1000 mg/100 mL premix (0 mg Intravenous Stopped 10/19/21 1500)  ?  Followed by  ?levETIRAcetam (KEPPRA) IVPB 1000 mg/100 mL premix (0 mg Intravenous Stopped 10/19/21 1509)  ?LORazepam (ATIVAN) injection 1 mg (1 mg Intramuscular Given 10/19/21 1500)  ?dextrose 50 % solution 12.5 g ( Intravenous Not Given 10/19/21 1540)  ?dextrose 50 % solution 12.5 g (12.5 g Intravenous Given 10/19/21 1605)  ?dextrose 50 % solution 50 mL (50 mLs Intravenous Given 10/19/21 1645)  ? ?IMPRESSION / MDM / ASSESSMENT AND PLAN / ED COURSE  ?I reviewed the triage vital signs and the nursing notes. ?              ?               ?The patient is on the cardiac monitor to evaluate for evidence of arrhythmia and/or significant heart rate changes. ?Patient presents after recent seizure episode. ? ?Patient had slow return to baseli

## 2021-10-19 NOTE — Assessment & Plan Note (Addendum)
Patient has a history of type 1 diabetes mellitus on insulin and was brought into the ER for evaluation after he had a witnessed seizure at school. ?It is unclear if the seizure was triggered by a hypoglycemic episode as patient was said to have received insulin and had no eaten prior to having the seizure. ?Initial blood sugar by EMS was 91 but he has had several hypoglycemic episodes in the ER. ?Last hemoglobin A1c level from 09/13/21 is 4.7. ??  Not sure if patient continues to need insulin with a normal hemoglobin A1c level. ?Hold insulin for now ?Continue dextrose containing fluids initiated in the ER and check blood sugars every hour if stable will change to every 2 hours until patient is more awake and alert. ?

## 2021-10-19 NOTE — ED Notes (Signed)
Report given to Amy, RN to move patient to CPOD room 33 ?

## 2021-10-19 NOTE — Assessment & Plan Note (Signed)
>>  ASSESSMENT AND PLAN FOR SEIZURE (HCC) WRITTEN ON 10/19/2021  5:04 PM BY AGBATA, TOCHUKWU, MD  Patient with a known history of seizure disorder on gabapentin  and recently started on Lamictal  who presented to the ER after he had a witnessed seizure at school. Patient was loaded with Keppra  in the ER and received a dose of Ativan  Hold gabapentin  and Lamictal  for now due to lethargy Continue patient on IV Keppra  until he is more awake and alert to resume his oral AED Continue as needed lorazepam  for breakthrough seizures Seizure precautions

## 2021-10-19 NOTE — ED Notes (Signed)
Pt found OOB, wandering around in room.  ?

## 2021-10-19 NOTE — Assessment & Plan Note (Signed)
Hold all antipsychotic medications since patient is lethargic ?We will resume p.o. meds once patient is more awake and alert ?

## 2021-10-19 NOTE — ED Notes (Signed)
Dr. Vicente Males notified of CBG 75. Verbal order for 1/2 amp D50 and to give oral food/drink. ?

## 2021-10-19 NOTE — H&P (Signed)
?History and Physical  ? ? ?Patient: Phillip BrookeJoseph Murguia WGN:[id number] DOB: [birth year] ?DOA: 10/19/2021 ?DOS: the patient was seen and examined on 10/19/2021 ?PCP: Pcp, No  ?Patient coming from: Home ? ?Chief Complaint:  ?Chief Complaint  ?Patient presents with  ? Seizures  ? ?HPI: Phillip Barajas is a 21 y.o. male with medical history significant for autism spectrum disorder, ADHD, seizure disorder, type 1 diabetes mellitus who presents to the ER via EMS from school after he had a seizure lasting about 8 minutes.  Per EMS patient had received insulin around lunchtime and had not eaten.  His blood sugar was 91 per EMS. ?His blood sugar later dropped in the ER to 32 and patient received 1 amp of D50. ?He was loaded with IV Keppra in the ER after he had a witnessed seizure while in [place] and also received a dose of lorazepam 1 mg. ?Patient has remained hypoglycemic despite several doses of D50 and has been started on a D10 infusion. ?I am unable to do review of systems on this patient as he is lethargic. ?At  ?Review of Systems: unable to review all systems due to the inability of the patient to answer questions. ?Past Medical History:  ?Diagnosis Date  ? Autism   ? Diabetes mellitus without complication (HCC)   ? ?History reviewed. No pertinent surgical history. ?Social History:  reports that he has never smoked. He has never used smokeless tobacco. He reports that he does not drink alcohol and does not use drugs. ? ?No Known Allergies ? ?No family history on file. ? ?Prior to Admission medications   ?Medication Sig Start Date End Date Taking? Authorizing Provider  ?amantadine (SYMMETREL) 100 MG capsule TAKE 100mg  in the morning, 200mg  at noon, and 100mg  at 7PM. 05/14/18   [provider]  ?cetirizine (ZYRTEC) 10 MG tablet Take 1 tablet by mouth daily. 09/27/20   [provider]  ?cloNIDine (CATAPRES) 0.1 MG tablet Give 0.1mg  in the morning, 0.05mg  at noon, and 0.1mg  at night 05/14/18   [provider]   ?FLUoxetine (PROZAC) 10 MG capsule Take 10 mg by mouth daily.    [provider]  ?fluticasone (FLONASE) 50 MCG/ACT nasal spray Place 2 sprays into both nostrils daily. 04/26/21   [provider]  ?gabapentin (NEURONTIN) 400 MG capsule Take 2 capsules (800 mg total) by mouth at bedtime. 09/14/21   Charise KillianWilliams, Jamiese M, MD  ?gabapentin (NEURONTIN) 400 MG capsule Take 1 capsule (400 mg total) by mouth every morning. 09/15/21   Charise KillianWilliams, Jamiese M, MD  ?guaifenesin (HUMIBID E) 400 MG TABS tablet Take 400 mg by mouth every 4 (four) hours as needed. 09/27/20   [provider]  ?ibuprofen (ADVIL) 400 MG tablet Take 400 mg by mouth every 8 (eight) hours as needed. 09/27/20   [provider]  ?insulin glargine (LANTUS SOLOSTAR) 100 UNIT/ML Solostar Pen Inject 25 Units into the skin at bedtime. 05/21/18   [provider]  ?insulin lispro (HUMALOG) 100 UNIT/ML injection Inject 6-8 Units into the skin 3 (three) times daily before meals. 6 units qam  ?8 units at lunch and supper    [provider]  ?lamoTRIgine (LAMICTAL) 25 MG tablet Take 1 tablet (25 mg total) by mouth 2 (two) times daily for 14 days. 09/12/21 09/26/21  Gilles ChiquitoSmith, Zachary P, MD  ?lithium carbonate 300 MG capsule Take 1,200 mg by mouth 2 (two) times daily with a meal. 05/14/18   [provider]  ?LORazepam (ATIVAN) 0.5 MG tablet Take  0.5 mg by mouth every 8 (eight) hours as needed. 05/10/21   [provider]  ?melatonin 5 MG TABS Take 10 mg by mouth at bedtime. 09/27/20   [provider]  ?metFORMIN (GLUCOPHAGE) 500 MG tablet Take 500 mg by mouth 2 (two) times daily with a meal. 01/25/21   [provider]  ?risperiDONE (RISPERDAL) 2 MG tablet Take 2 mg by mouth in the morning, at noon, and at bedtime. 05/14/18   [provider]  ?senna (SENOKOT) 8.6 MG tablet Take as needed for constipation 04/26/21   [provider]  ?sodium chloride (OCEAN) 0.65 % nasal spray instill  ONE SPRAY IN EACH NOSTRIL AS NEEDED FOR nasal congestion 03/12/19   [provider]  ? ? ?Physical Exam: ?Vitals:  ? 10/19/21 1445 10/19/21 1500 10/19/21 1530 10/19/21 1600  ?BP: 131/62 (!) 118/59 109/79 114/72  ?Pulse: 76 75 75 69  ?Resp: 15 10 16 12   ?Temp:      ?TempSrc:      ?SpO2: 99% 100% 99% 99%  ?Weight:      ?Height:      ? ?Physical Exam ?Vitals and nursing note reviewed.  ?Constitutional:   ?   Comments: Lethargic but opens eyes to loud verbal stimuli.  ?HENT:  ?   Head: Normocephalic and atraumatic.  ?   Nose: Nose normal.  ?   Mouth/Throat:  ?   Mouth: Mucous membranes are moist.  ?Eyes:  ?   Conjunctiva/sclera: Conjunctivae normal.  ?Cardiovascular:  ?   Rate and Rhythm: Normal rate and regular rhythm.  ?Pulmonary:  ?   Effort: Pulmonary effort is normal.  ?   Breath sounds: Normal breath sounds.  ?Abdominal:  ?   General: Abdomen is flat. Bowel sounds are normal.  ?   Palpations: Abdomen is soft.  ?Musculoskeletal:     ?   General: Normal range of motion.  ?   Cervical back: Normal range of motion and neck supple.  ?Skin: ?   General: Skin is warm and dry.  ?   Comments: Multiple scabbed wounds on his forearms  ?Neurological:  ?   Comments: Unable to assess patient is lethargic  ?Psychiatric:  ?   Comments: Unable to assess patient is lethargic  ? ? ?Data Reviewed: ?Relevant notes from primary care and specialist visits, past discharge summaries as available in EHR, including Care Everywhere. ?Prior diagnostic testing as pertinent to current admission diagnoses ?Updated medications and problem lists for reconciliation ?ED course, including vitals, labs, imaging, treatment and response to treatment ?Triage notes, nursing and pharmacy notes and ED provider's notes ?Notable results as noted in HPI ?Labs reviewed.  White count 7.5, hemoglobin 10.6, hematocrit 32.2, RDW 14.1, platelet count 154, sodium 136, potassium 3.6, chloride 103, bicarb 25, glucose 258, BUN 13, creatinine 1.32, calcium  9.2 ?Twelve-lead EKG reviewed by me shows sinus rhythm with LVH ?There are no new results to review at this time. ? ?Assessment and Plan: ?* Hypoglycemia due to type 1 diabetes mellitus (HCC) ?Patient has a history of type 1 diabetes mellitus on insulin and was brought into the ER for evaluation after he had a witnessed seizure at school. ?It is unclear if the seizure was triggered by a hypoglycemic episode as patient was said to have received insulin and had no eaten prior to having the seizure. ?Initial blood sugar by EMS was 91 but he has had several hypoglycemic episodes in the ER. ?Last hemoglobin A1c level from 09/13/21 is 4.7. ??  Not sure if patient continues to need insulin with a normal hemoglobin A1c level. ?Hold insulin for now ?Continue dextrose containing fluids initiated in the ER and check blood sugars every hour if stable will change to every 2 hours until patient is more awake and alert. ? ?Seizure (HCC) ?Patient with a known history of seizure disorder on gabapentin and recently started on Lamictal who presented to the ER after he had a witnessed seizure at school. ?Patient was loaded with Keppra in the ER and received a dose of Ativan ?Hold gabapentin and Lamictal for now due to lethargy ?Continue patient on IV Keppra until he is more awake and alert to resume his oral AED ?Continue as needed lorazepam for breakthrough seizures ?Seizure precautions ? ?Bipolar 2 disorder (HCC) ?Hold all antipsychotic medications since patient is lethargic ?We will resume p.o. meds once patient is more awake and alert ? ? ? ? ? Advance Care Planning:   Code Status: Full Code  ? ?Consults: None ? ?Family Communication: I attempted to reach patient's mother Rontrell Moquin over the phone.  Left voicemail.  Awaiting callback ? ?Severity of Illness: ?The appropriate patient status for this patient is OBSERVATION. Observation status is judged to be reasonable and necessary in order to provide the required intensity of  service to ensure the patient's safety. The patient's presenting symptoms, physical exam findings, and initial radiographic and laboratory data in the context of their medical condition is felt to place them

## 2021-10-19 NOTE — ED Notes (Signed)
Patient pulled out IV and pulled off cardiac leads. RN replaced IV and tech replaced cardiac leads ?

## 2021-10-19 NOTE — Progress Notes (Signed)
Notified by RN that patient fell.  Did not hit his head.  He will require a sitter due to his mental status changes and disorientation. ?Awaiting results of CT head ?

## 2021-10-19 NOTE — ED Notes (Signed)
Pt found up out of bed, one PIV pulled out by pt. Pt redirected into bed, cleaned, linens changed. Pt voided into urinal. RN aware. ?

## 2021-10-19 NOTE — ED Notes (Signed)
CT called to confirm when pt can have CT head.  ?

## 2021-10-19 NOTE — Assessment & Plan Note (Addendum)
Patient with a known history of seizure disorder on gabapentin and recently started on Lamictal who presented to the ER after he had a witnessed seizure at school. ?Patient was loaded with Keppra in the ER and received a dose of Ativan ?Hold gabapentin and Lamictal for now due to lethargy ?Continue patient on IV Keppra until he is more awake and alert to resume his oral AED ?Continue as needed lorazepam for breakthrough seizures ?Seizure precautions ?

## 2021-10-19 NOTE — ED Notes (Signed)
Pt transported to CT via stretcher.  

## 2021-10-19 NOTE — ED Provider Notes (Signed)
Procedures ? ?  ? ?----------------------------------------- ?4:24 PM on 10/19/2021 ?----------------------------------------- ?Patient again had a recurrent episode of hypoglycemia with a blood sugar in the 40s causing somnolence.  He did not have any recurrent seizure, but required a third amp of D50 to normalize his blood sugar and improve his mental status.  I will need to start him on a D10 infusion and admit until glucose can be stabilized.  Case discussed with the hospitalist. ? ? ?  ?Carrie Mew, MD ?10/19/21 1624 ? ?

## 2021-10-20 DIAGNOSIS — E10649 Type 1 diabetes mellitus with hypoglycemia without coma: Secondary | ICD-10-CM | POA: Diagnosis not present

## 2021-10-20 LAB — BASIC METABOLIC PANEL
Anion gap: 7 (ref 5–15)
BUN: 9 mg/dL (ref 6–20)
CO2: 28 mmol/L (ref 22–32)
Calcium: 9.5 mg/dL (ref 8.9–10.3)
Chloride: 105 mmol/L (ref 98–111)
Creatinine, Ser: 1.33 mg/dL — ABNORMAL HIGH (ref 0.61–1.24)
GFR, Estimated: >60 mL/min (ref >60)
Glucose, Bld: 149 mg/dL — ABNORMAL HIGH (ref 70–99)
Potassium: 3.8 mmol/L (ref 3.5–5.1)
Sodium: 140 mmol/L (ref 135–145)

## 2021-10-20 LAB — CBC
HCT: 33.9 % — ABNORMAL LOW (ref 39.0–52.0)
Hemoglobin: 11.3 g/dL — ABNORMAL LOW (ref 13.0–17.0)
MCH: 29.2 pg (ref 26.0–34.0)
MCHC: 33.3 g/dL (ref 30.0–36.0)
MCV: 87.6 fL (ref 80.0–100.0)
Platelets: 178 10*3/uL (ref 150–400)
RBC: 3.87 MIL/uL — ABNORMAL LOW (ref 4.22–5.81)
RDW: 14.2 % (ref 11.5–15.5)
WBC: 7.9 10*3/uL (ref 4.0–10.5)
nRBC: 0 % (ref 0.0–0.2)

## 2021-10-20 LAB — CBG MONITORING, ED
Glucose-Capillary: 143 mg/dL — ABNORMAL HIGH (ref 70–99)
Glucose-Capillary: 146 mg/dL — ABNORMAL HIGH (ref 70–99)
Glucose-Capillary: 153 mg/dL — ABNORMAL HIGH (ref 70–99)
Glucose-Capillary: 222 mg/dL — ABNORMAL HIGH (ref 70–99)

## 2021-10-20 LAB — LAMOTRIGINE LEVEL: Lamotrigine Lvl: 2.7 ug/mL (ref 2.0–20.0)

## 2021-10-20 MED ORDER — LEVETIRACETAM 500 MG PO TABS: 500.0000 mg | ORAL_TABLET | Freq: Two times a day (BID) | ORAL | 0 refills | Status: AC

## 2021-10-20 NOTE — ED Notes (Signed)
Pt pulled off cardiac leads and pulse-ox. This writer replaced cardiac leads and pulse-ox. ?

## 2021-10-20 NOTE — ED Notes (Signed)
Hand mites were applied due to pt repeatedly removing IV, BP cuff, EKG leads, and pulse ox being removed. Pt is calm when applying, will countine to monitor pt. ?

## 2021-10-20 NOTE — ED Notes (Signed)
Spoke with Randal Buba at [facility] to let her know pt was ready for discharge and that they would need to come pick him up- she states she will call this RN back when she has an ETA ?

## 2021-10-20 NOTE — ED Notes (Signed)
Patient pulled out IV.

## 2021-10-20 NOTE — ED Notes (Addendum)
Pt pulled BP cuff and pulse-ox off and pulled IV out. Writer notified Nurse about IV and placed BP cuff and pulse-ox back on. ?

## 2021-10-20 NOTE — TOC CM/SW Note (Addendum)
Patient is from [facility]. Tried calling mom x 2, left one voicemail. Left voicemail for group home administrator Phillip Barajas [phone]). MD said she did speak with mom regarding discharge today. ? ?Charlynn Court, CSW ?[phone] ? ?2:15 pm: Received call back from [place]. She is aware of plan for discharge today and asked that we send discharge summary with him. RN will print. Quillian Quince is trying to coordinate transportation with staff. No further concerns. CSW signing off. ? ?Charlynn Court, CSW ?[phone] ? ?

## 2021-10-20 NOTE — Discharge Summary (Signed)
?Physician Discharge Summary ?  ?Patient: Phillip Barajas MRN: [id number] DOB: [birth year]  ?Admit date:     10/19/2021  ?Discharge date: 10/20/21  ?Discharge Physician: Arnetha Courser  ? ?PCP: Lorenda Ishihara, MD  ? ?Recommendations at discharge:  ?Patient need to follow-up with neurology as an outpatient for further characterization and management of his possible seizure-like activity. ?We stopped Lamictal and started him on Keppra. ?Decreased in blood glucose level can also resulted in seizure-like activity, please ensure that patient eats after taking insulin. ?If experiencing poor p.o. intake, please decrease the dose of insulin or skip it. ?Insulin should only be given if he is eating more than 50% of his meals. ?Patient also need monitoring of his psych medications, please follow-up with psychiatrist for further recommendations. ?Follow-up with primary care doctor and our endocrinologist who is managing his insulin for further recommendation as he might need to have some decreased in insulin due to very well tightly controlled diabetes and A1c of 4.7. ? ?Discharge Diagnoses: ?Principal Problem: ?  Hypoglycemia due to type 1 diabetes mellitus (HCC) ?Active Problems: ?  Seizure (HCC) ?  Bipolar 2 disorder (HCC) ? ? ?Hospital Course: ?Phillip Barajas is a 21 y.o. male with medical history significant for autism spectrum disorder, ADHD, seizure disorder, type 1 diabetes mellitus who presents to the ER via EMS from school after he had a seizure lasting about 8 minutes.  Per EMS patient had received insulin around lunchtime and had not eaten.  His blood sugar was 91 per EMS. ?His blood sugar later dropped in the ER to 32 and patient received 1 amp of D50. ?He was loaded with IV Keppra in the ER after he had a witnessed seizure while in CT and also received a dose of lorazepam 1 mg. ?Patient has remained hypoglycemic despite several doses of D50 and has been started on a D10 infusion.  Later his blood glucose level  stabilizes and he was able to wean off from D10 infusion.  He was able to maintain his blood glucose level after eating, mild hyperglycemia.  Patient has very well-controlled diabetes with A1c of 4.7, which also questions whether this diagnosis is correct.  Patient need to have a very close follow-up with his primary care provider and/or endocrinologist for further management of his insulin.  He might not need that much of insulin.  Will get benefit from a continuous glucose monitor and an insulin pump if someone is able to manage. ? ?His group home should avoid giving him insulin if he is not eating his meals. ?Hypoglycemia itself can cause seizure-like activity. ?Talked with mother on phone and according to her he was recently diagnosed with seizures, imaging remains negative for any acute trauma.  He was given Lamictal for 2 weeks, we discontinued Lamictal and started him on Keppra after giving a loading dose due to a witnessed seizure in the ED. patient need to have a very close follow-up with his neurologist for further characterization and management of his seizure disorder. ? ?Patient is pretty much nonverbal at baseline, just able to tell me his name. ? ?Except switching Lamictal with Keppra we did not make any changes to his current medications but patient takes a lot of psych meds and need for very close follow-up with his psychiatrist for medication reconciliation and management. ? ?Continue current medications and follow-up with his providers. ? ?Assessment and Plan: ?* Hypoglycemia due to type 1 diabetes mellitus (HCC) ?Patient has a history of type 1 diabetes mellitus on  insulin and was brought into the ER for evaluation after he had a witnessed seizure at school. ?It is unclear if the seizure was triggered by a hypoglycemic episode as patient was said to have received insulin and had no eaten prior to having the seizure. ?Initial blood sugar by EMS was 91 but he has had several hypoglycemic episodes  in the ER. ?Last hemoglobin A1c level from 09/13/21 is 4.7. ??  Not sure if patient continues to need insulin with a normal hemoglobin A1c level. ?Hold insulin for now ?Continue dextrose containing fluids initiated in the ER and check blood sugars every hour if stable will change to every 2 hours until patient is more awake and alert. ? ?Seizure (HCC) ?Patient with a known history of seizure disorder on gabapentin and recently started on Lamictal who presented to the ER after he had a witnessed seizure at school. ?Patient was loaded with Keppra in the ER and received a dose of Ativan ?Hold gabapentin and Lamictal for now due to lethargy ?Continue patient on IV Keppra until he is more awake and alert to resume his oral AED ?Continue as needed lorazepam for breakthrough seizures ?Seizure precautions ? ?Bipolar 2 disorder (HCC) ?Hold all antipsychotic medications since patient is lethargic ?We will resume p.o. meds once patient is more awake and alert ? ? ?Consultants: Neurology ?Procedures performed: None ?Disposition: Group home ?Diet recommendation:  ?Discharge Diet Orders (From admission, onward)  ? ?  Start     Ordered  ? 10/20/21 0000  Diet - low sodium heart healthy       ? 10/20/21 1347  ? ?  ?  ? ?  ? ?Carb modified diet ?DISCHARGE MEDICATION: ?Allergies as of 10/20/2021   ?No Known Allergies ?  ? ?  ?Medication List  ?  ? ?STOP taking these medications   ? ?lamoTRIgine 25 MG tablet ?Commonly known as: LaMICtal ?  ? ?  ? ?TAKE these medications   ? ?amantadine 100 MG capsule ?Commonly known as: SYMMETREL ?TAKE 100mg  in the morning, 200mg  at noon, and 100mg  at 7PM. ?  ?ascorbic acid 500 MG tablet ?Commonly known as: VITAMIN C ?Take 500 mg by mouth daily. ?  ?cetirizine 10 MG tablet ?Commonly known as: ZYRTEC ?Take 1 tablet by mouth daily. ?  ?cloNIDine 0.1 MG tablet ?Commonly known as: CATAPRES ?Give 0.1mg  in the morning, 0.05mg  at noon, and 0.1mg  at night ?  ?FLUoxetine 10 MG capsule ?Commonly known as:  PROZAC ?Take 15 mg by mouth daily. ?  ?fluticasone 50 MCG/ACT nasal spray ?Commonly known as: FLONASE ?Place 2 sprays into both nostrils daily. ?  ?gabapentin 400 MG capsule ?Commonly known as: NEURONTIN ?Take 2 capsules (800 mg total) by mouth at bedtime. ?  ?gabapentin 400 MG capsule ?Commonly known as: NEURONTIN ?Take 1 capsule (400 mg total) by mouth every morning. ?  ?guaifenesin 400 MG Tabs tablet ?Commonly known as: HUMIBID E ?Take 400 mg by mouth every 4 (four) hours as needed. ?  ?Gvoke HypoPen 2-Pack 1 MG/0.2ML Soaj ?Generic drug: Glucagon ?Inject 1 mg into the skin as directed. ?  ?ibuprofen 400 MG tablet ?Commonly known as: ADVIL ?Take 400 mg by mouth every 8 (eight) hours as needed. ?  ?insulin lispro 100 UNIT/ML injection ?Commonly known as: HUMALOG ?Inject 6-8 Units into the skin 3 (three) times daily before meals. 6 units qam  ?8 units at lunch and supper ?  ?Lantus SoloStar 100 UNIT/ML Solostar Pen ?Generic drug: insulin glargine ?Inject 25 Units into  the skin at bedtime. ?  ?levETIRAcetam 500 MG tablet ?Commonly known as: Keppra ?Take 1 tablet (500 mg total) by mouth 2 (two) times daily. ?  ?lithium carbonate 300 MG capsule ?Take 300 mg by mouth 2 (two) times daily with a meal. ?  ?LORazepam 0.5 MG tablet ?Commonly known as: ATIVAN ?Take 0.5 mg by mouth every 8 (eight) hours as needed. ?  ?melatonin 5 MG Tabs ?Take 10 mg by mouth at bedtime. ?  ?metFORMIN 500 MG tablet ?Commonly known as: GLUCOPHAGE ?Take 500-1,000 mg by mouth 2 (two) times daily with a meal. Take 500 mg every morning and 1000 mg at bedtime ?  ?risperiDONE 2 MG tablet ?Commonly known as: RISPERDAL ?Take 2 mg by mouth in the morning, at noon, and at bedtime. ?  ?senna 8.6 MG tablet ?Commonly known as: SENOKOT ?Take as needed for constipation ?  ?sodium chloride 0.65 % nasal spray ?Commonly known as: OCEAN ?instill ONE SPRAY IN EACH NOSTRIL AS NEEDED FOR nasal congestion ?  ? ?  ? ? Follow-up Information   ? ? Excell Seltzer, Gray Bernhardt,  MD. Schedule an appointment as soon as possible for a visit in 1 week(s).   ?Specialty: Pediatrics ?Contact information: ?[address] [place] ?[address] ?[place] [address] ?[phone] ? ? ?  ?  ? ?  ?  ? ?  ? ?Judi Cong

## 2021-10-20 NOTE — ED Notes (Signed)
Pt caregiver unable to wait for prescription despite this RN asking him to wait- pt has left with caregiver ?

## 2021-10-20 NOTE — ED Notes (Signed)
External catheter placed on patient by CNA ?

## 2022-12-11 IMAGING — CT CT HEAD W/O CM
5 of 6 series · 18 of 47 positions shown, 19 images · non-contrast
Comparison: September 12, 2021.

CLINICAL DATA: Seizure.



[Series 2: head wo · axial · 0.46mm/px · z∈[+848,+908]mm · 2 of 36 slices shown, 3 images (1 of 2)]
[im 12/36  brain]
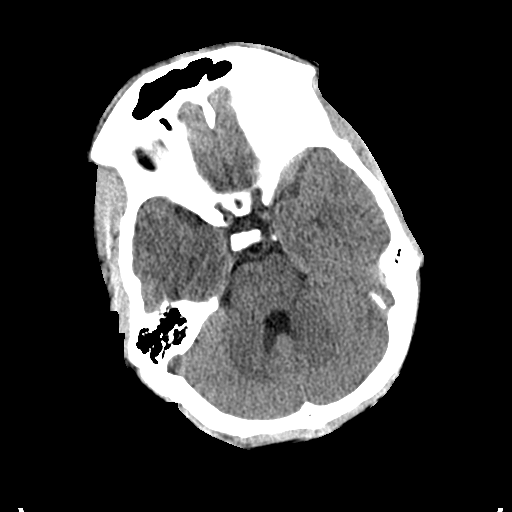
[im 12/36  bone]
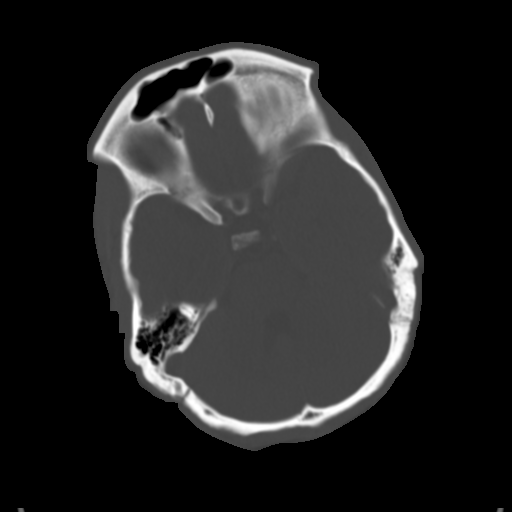
[im 24/36  brain]
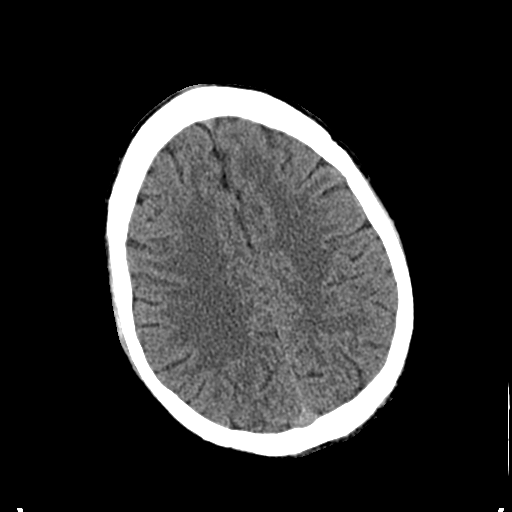

[Series 3: head bone · axial · 0.46mm/px · z∈[+809,+951]mm · 8 of 89 slices shown]
[im 9/89  bone]
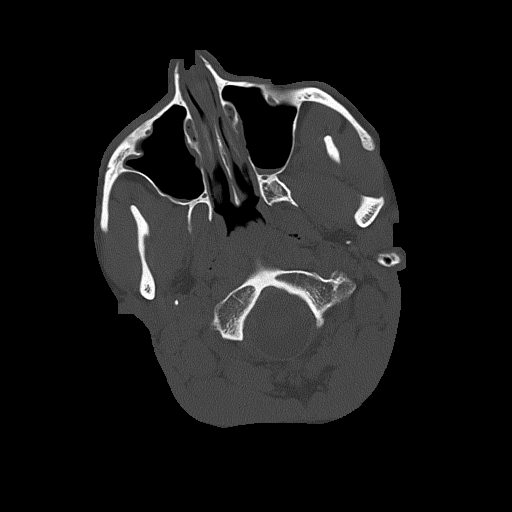
[im 18/89  bone]
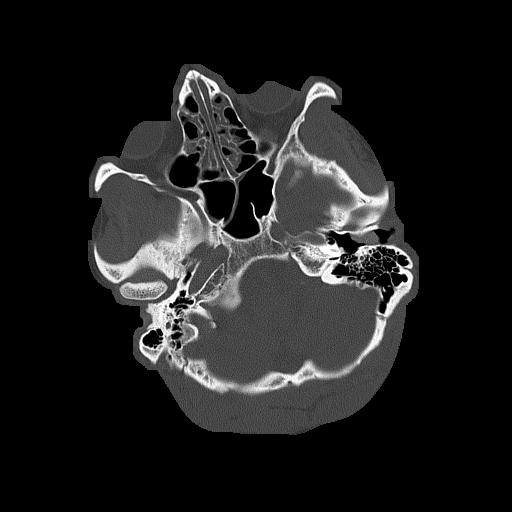
[im 27/89  bone]
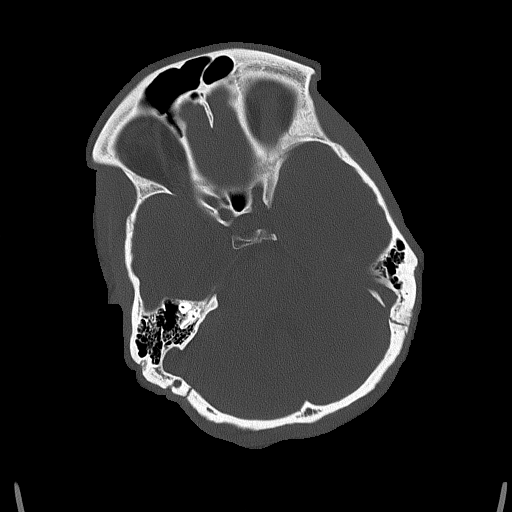
[im 36/89  bone]
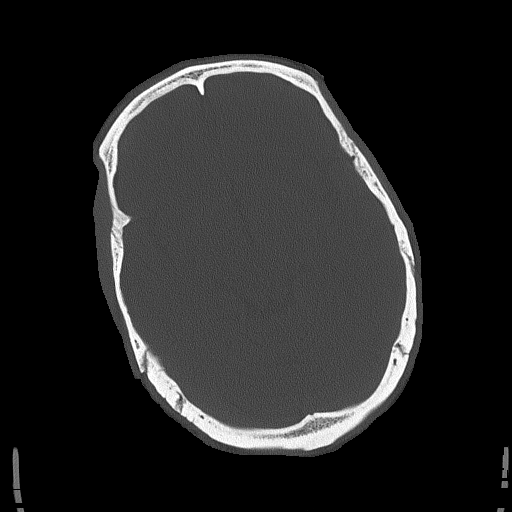
[im 53/89  bone]
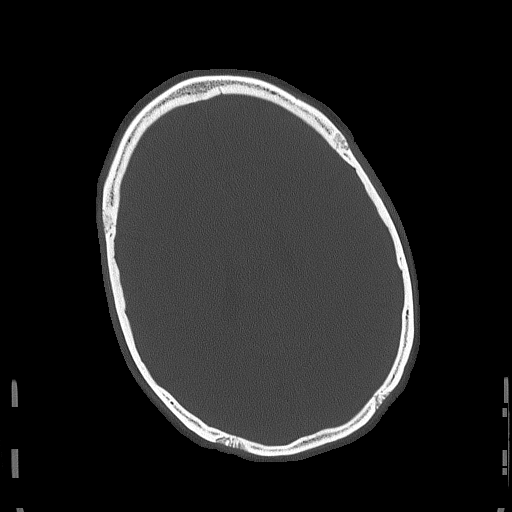
[im 62/89  bone]
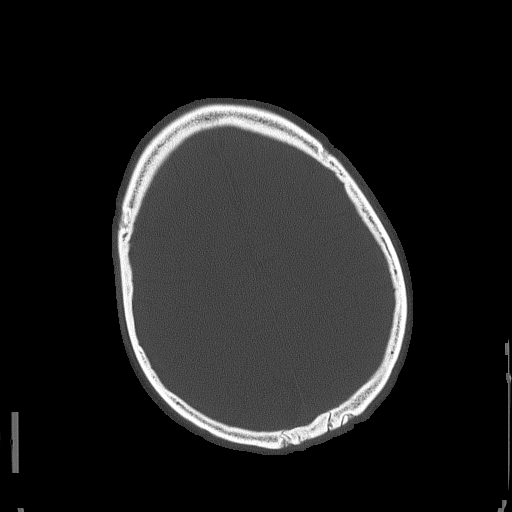
[im 71/89  bone]
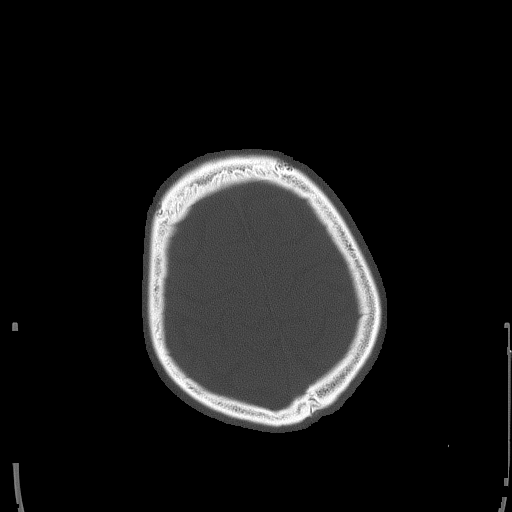
[im 80/89  bone]
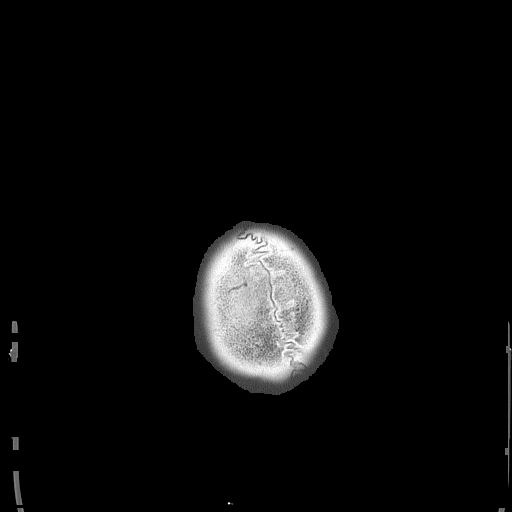

[Series 4: head wo · axial · 0.34mm/px · z∈[+849,+908]mm · 2 of 36 slices shown (2 of 2)]
[im 12/36  brain]
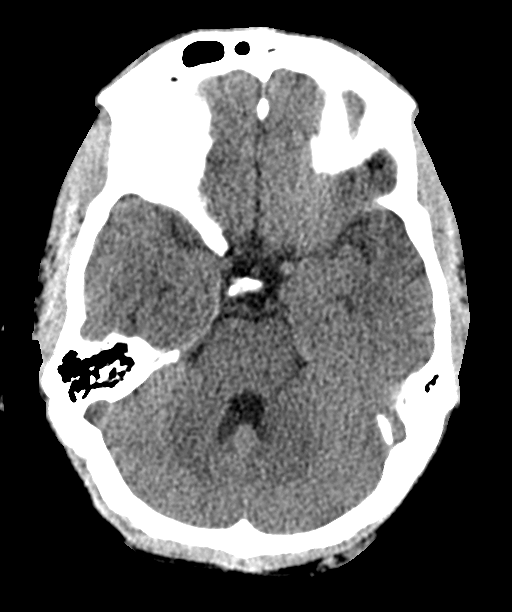
[im 24/36  brain]
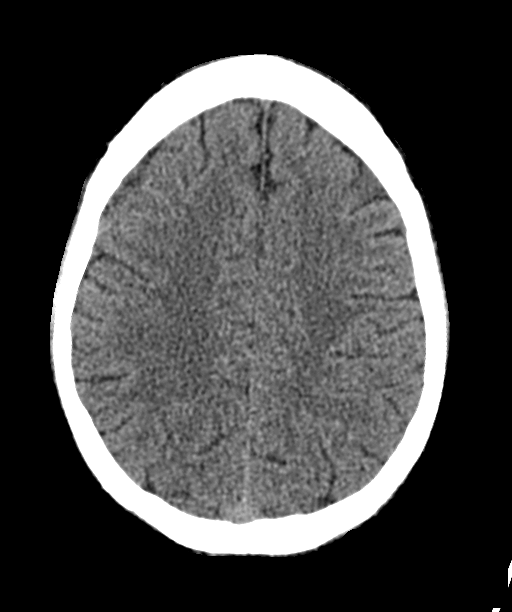

[Series 6: coronal soft tissue · coronal · 0.34mm/px · 3 of 70 slices shown]
[im 24/70  brain]
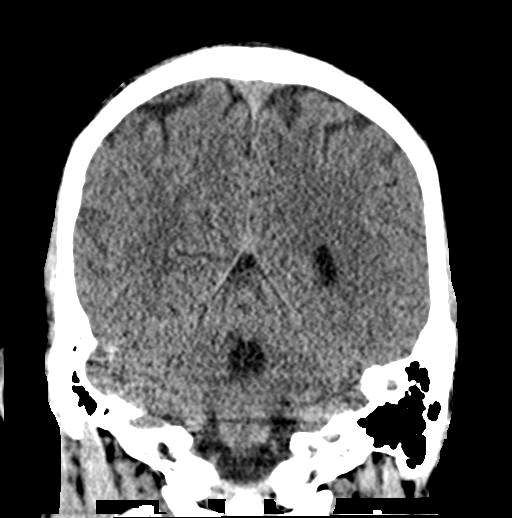
[im 31/70  brain]
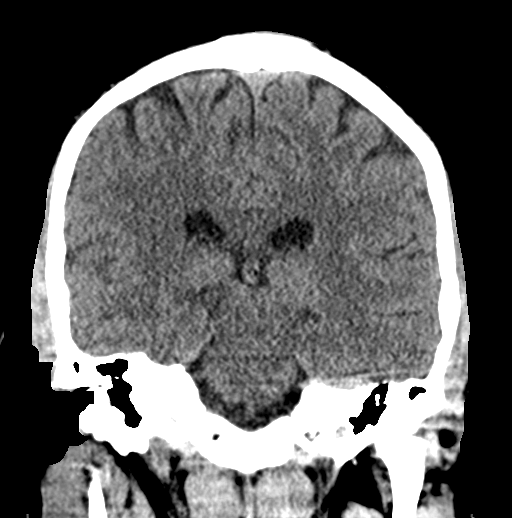
[im 39/70  brain]
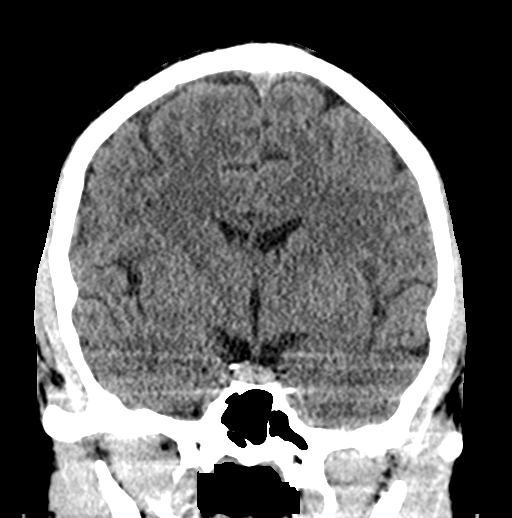

[Series 7: sagittal soft tissue · sagittal · 0.34mm/px · 3 of 59 slices shown]
[im 20/59  brain]
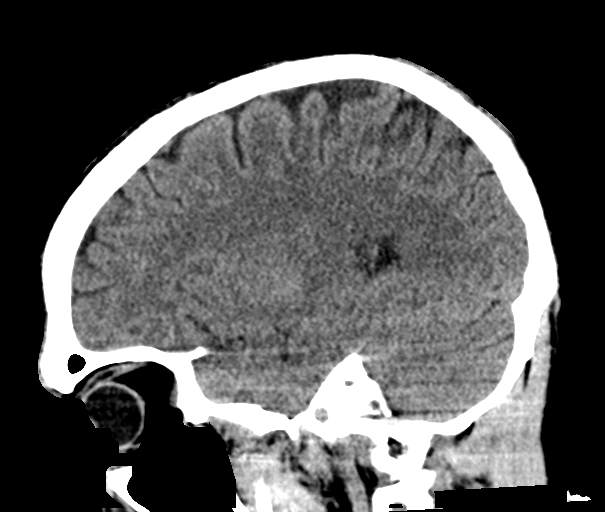
[im 30/59  brain]
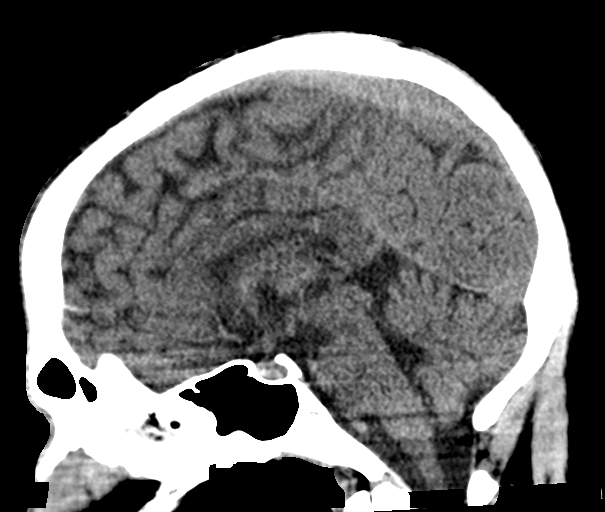
[im 39/59  brain]
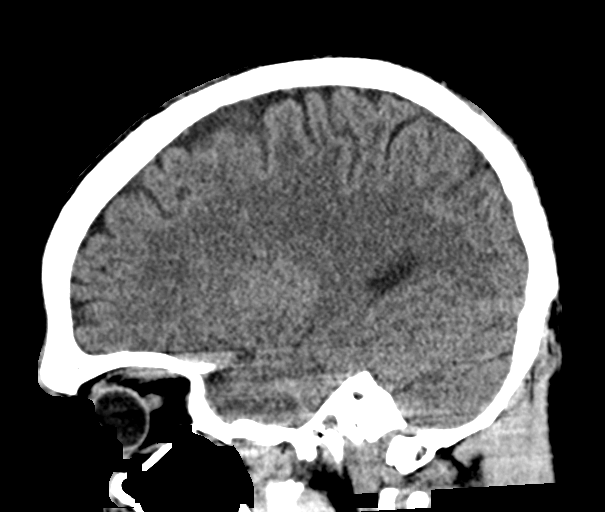

[18 of 47 positions shown; findings below may reference images not displayed]

FINDINGS: Brain: No evidence of acute infarction, hemorrhage, hydrocephalus,
extra-axial collection or mass lesion/mass effect.

Vascular: No hyperdense vessel or unexpected calcification.

Skull: Normal. Negative for fracture or focal lesion.

Sinuses/Orbits: Mild mucosal thickening is seen involving both
maxillary and ethmoid sinuses.

Other: None.
IMPRESSION: No acute intracranial abnormality seen.

## 2024-05-19 ENCOUNTER — Ambulatory Visit: Payer: MEDICAID | Admitting: Physician Assistant

## 2024-05-19 ENCOUNTER — Encounter: Payer: Self-pay | Admitting: Physician Assistant

## 2024-05-19 DIAGNOSIS — Z79899 Other long term (current) drug therapy: Secondary | ICD-10-CM | POA: Insufficient documentation

## 2024-06-03 ENCOUNTER — Other Ambulatory Visit: Payer: Self-pay | Admitting: Family Medicine

## 2024-06-03 ENCOUNTER — Ambulatory Visit: Payer: MEDICAID | Admitting: Family Medicine

## 2024-06-03 ENCOUNTER — Encounter: Payer: Self-pay | Admitting: Family Medicine

## 2024-06-03 VITALS — BP 118/62 | HR 87 | Ht 70.0 in | Wt 164.0 lb

## 2024-06-03 DIAGNOSIS — Z79899 Other long term (current) drug therapy: Secondary | ICD-10-CM

## 2024-06-03 DIAGNOSIS — E1065 Type 1 diabetes mellitus with hyperglycemia: Secondary | ICD-10-CM

## 2024-06-03 DIAGNOSIS — R569 Unspecified convulsions: Secondary | ICD-10-CM

## 2024-06-03 DIAGNOSIS — F840 Autistic disorder: Secondary | ICD-10-CM

## 2024-06-03 DIAGNOSIS — Z136 Encounter for screening for cardiovascular disorders: Secondary | ICD-10-CM

## 2024-06-03 DIAGNOSIS — F79 Unspecified intellectual disabilities: Secondary | ICD-10-CM

## 2024-06-03 DIAGNOSIS — F3181 Bipolar II disorder: Secondary | ICD-10-CM

## 2024-06-03 DIAGNOSIS — H6121 Impacted cerumen, right ear: Secondary | ICD-10-CM

## 2024-06-03 DIAGNOSIS — Z13220 Encounter for screening for lipoid disorders: Secondary | ICD-10-CM

## 2024-06-03 NOTE — Progress Notes (Signed)
 New Patient Office Visit  Patient ID: Gergory Biello, Male   DOB: 2001/03/24 22 y.o. MRN: [id number]  Chief Complaint  Patient presents with   Establish Care    With caregiver from group home; would like hearing checked, no other concerns   Subjective:     Zoey Gilkeson presents to establish care  HPI  Discussed the use of AI scribe software for clinical note transcription with the patient, who gave verbal consent to proceed.  History of Present Illness Alvan Culpepper is a 23 year old male who presents for establishing care.     Diabetes mellitus type 1. Managed by [facility] endocrinology. Per chart review patient has low C-peptide and positive gad antibodies? On long-acting Lantus  12 units daily, and sliding scale. No recent hypoglycemic or hyperglycemic events   History of bipolar 2 disorder, ADHD, aggressive behavior, intellectual disability, e, autism, Follows Dr. Chyrl Lee at [facility].  Fairy SHAUNNA Bode, M.D. Consulting Associate Professor Department of Psychiatry and [employer] tel. [phone]    Seizure like activity History of recent seizures. On antiepileptic medications. Failed to follow-up.  Visit with neurology 2023.  At the time recommendation was to follow-up with Dr. Candy. Dr. Arthea Farrow, MD [facility] [place], [place] Ph: [phone] Fax: [phone]   He is currently stable on multiple medications, including those for  mood, diabetes and seizures. He has type 1 diabetes, managed by an endocrinologist.  He resides in a group home called [facility], where he has 24-hour staff support. His mother lives in [place] but has not visited in three years. He has no issues with his roommates and is reported to be doing well in his current living situation.   Outpatient Encounter Medications as of 06/03/2024  Medication Sig   amantadine  (SYMMETREL ) 100 MG capsule TAKE 100mg  in the morning, 200mg  at noon,  and 100mg  at 7PM.   ascorbic acid (VITAMIN C) 500 MG tablet Take 500 mg by mouth daily.   cetirizine (ZYRTEC) 10 MG tablet Take 1 tablet by mouth daily.   cloNIDine  (CATAPRES ) 0.1 MG tablet Give 0.1mg  in the morning, 0.05mg  at noon, and 0.1mg  at night   cloZAPine (CLOZARIL) 100 MG tablet Take 100 mg by mouth daily.   FLUoxetine  (PROZAC ) 10 MG tablet Take 10 mg by mouth daily.   fluticasone  (FLONASE ) 50 MCG/ACT nasal spray Place 2 sprays into both nostrils daily.   gabapentin  (NEURONTIN ) 400 MG capsule Take 1 capsule (400 mg total) by mouth every morning.   gabapentin  (NEURONTIN ) 400 MG capsule Take 800 mg by mouth at bedtime.   Glucagon (GVOKE HYPOPEN 2-PACK) 1 MG/0.2ML SOAJ Inject 1 mg into the skin as directed.   guaifenesin (HUMIBID E) 400 MG TABS tablet Take 400 mg by mouth every 4 (four) hours as needed.   ibuprofen (ADVIL) 400 MG tablet Take 400 mg by mouth every 8 (eight) hours as needed.   insulin  glargine (LANTUS  SOLOSTAR) 100 UNIT/ML Solostar Pen Inject 10 Units into the skin at bedtime.   insulin  lispro (HUMALOG) 100 UNIT/ML KwikPen Inject into the skin. Inject 6-8 Units into the skin 3 (three) times daily before meals. 6 units qam 8 units at lunch and supper   lamoTRIgine  (LAMICTAL ) 25 MG tablet Take 75 mg by mouth 2 (two) times daily.   levETIRAcetam  (KEPPRA ) 500 MG tablet Take 1 tablet (500 mg total) by mouth 2 (two) times daily.   LORazepam  (ATIVAN ) 0.5 MG tablet Take 0.5 mg  by mouth every 8 (eight) hours as needed.   LORazepam  (ATIVAN ) 1 MG tablet Take 1 mg by mouth every 8 (eight) hours as needed.   melatonin 5 MG TABS Take 10 mg by mouth at bedtime.   risperiDONE  (RISPERDAL ) 0.5 MG tablet Take 0.5 mg by mouth 3 (three) times daily as needed.   risperiDONE  (RISPERDAL ) 3 MG tablet Take 3 mg by mouth 2 (two) times daily.   senna (SENOKOT) 8.6 MG tablet Take as needed for constipation   sodium chloride  (OCEAN) 0.65 % nasal spray instill ONE SPRAY IN EACH NOSTRIL AS NEEDED FOR  nasal congestion   [DISCONTINUED] lithium  carbonate 300 MG capsule Take 300 mg by mouth 2 (two) times daily with a meal.   [DISCONTINUED] metFORMIN (GLUCOPHAGE) 500 MG tablet Take 500-1,000 mg by mouth 2 (two) times daily with a meal. Take 500 mg every morning and 1000 mg at bedtime   [DISCONTINUED] risperiDONE  (RISPERDAL ) 2 MG tablet Take 2 mg by mouth in the morning, at noon, and at bedtime.   [DISCONTINUED] FLUoxetine  (PROZAC ) 10 MG capsule Take 15 mg by mouth daily.   [DISCONTINUED] gabapentin  (NEURONTIN ) 400 MG capsule Take 2 capsules (800 mg total) by mouth at bedtime.   [DISCONTINUED] insulin  lispro (HUMALOG) 100 UNIT/ML injection Inject 6-8 Units into the skin 3 (three) times daily before meals. 6 units qam  8 units at lunch and supper   No facility-administered encounter medications on file as of 06/03/2024.    Past Medical History:  Diagnosis Date   Autism    Diabetes mellitus without complication (HCC)    Lithium  toxicity 09/13/2021    No past surgical history on file.  No family history on file.  Social History   Socioeconomic History   Marital status: Single    Spouse name: Not on file   Number of children: Not on file   Years of education: Not on file   Highest education level: Not on file  Occupational History   Not on file  Tobacco Use   Smoking status: Never   Smokeless tobacco: Never  Vaping Use   Vaping status: Never Used  Substance and Sexual Activity   Alcohol use: Never   Drug use: Never   Sexual activity: Not on file  Other Topics Concern   Not on file  Social History Narrative   Lives in group home   Social Drivers of Health   Tobacco Use: Low Risk (06/03/2024)   Patient History    Smoking Tobacco Use: Never    Smokeless Tobacco Use: Never    Passive Exposure: Not on file  Financial Resource Strain: Not on file  Food Insecurity: Not on file  Transportation Needs: Not on file  Physical Activity: Not on file  Stress: Not on file  Social  Connections: Not on file  Intimate Partner Violence: Not on file  Depression (EYV7-0): Not on file  Alcohol Screen: Not on file  Housing: Not on file  Utilities: Not on file  Health Literacy: Not on file    Review of Systems  All other systems reviewed and are negative.     Objective:    BP 118/62   Pulse 87   Ht 5' 10 (1.778 m)   Wt 164 lb (74.4 kg)   SpO2 98%   BMI 23.53 kg/m   Physical Exam Vitals and nursing note reviewed.  Constitutional:      Appearance: Normal appearance.  HENT:     Head: Normocephalic.  Right Ear: External ear normal. There is impacted cerumen.     Left Ear: External ear normal.  Eyes:     Conjunctiva/sclera: Conjunctivae normal.  Cardiovascular:     Rate and Rhythm: Normal rate.  Pulmonary:     Effort: Pulmonary effort is normal. No respiratory distress.  Abdominal:     Palpations: Abdomen is soft.  Musculoskeletal:        General: Normal range of motion.  Skin:    General: Skin is warm.  Neurological:     Mental Status: He is alert and oriented to person, place, and time.  Psychiatric:        Mood and Affect: Mood normal.    Physical Exam GENERAL: Normal exam findings.         Assessment & Plan:   Problem List Items Addressed This Visit       Other   Intellectual disability (Chronic)   Bipolar 2 disorder (HCC)   Seizure-like activity (HCC)   Other Visit Diagnoses       Impacted cerumen of right ear    -  Primary     Type 1 diabetes mellitus with hyperglycemia (HCC)       Relevant Medications   insulin  lispro (HUMALOG) 100 UNIT/ML KwikPen   Other Relevant Orders   Hemoglobin A1c   Comprehensive metabolic panel with GFR   Microalbumin / creatinine urine ratio     Encounter for long-term current use of medication       Relevant Orders   Comprehensive metabolic panel with GFR   CBC with Differential/Platelet     Encounter for lipid screening for cardiovascular disease       Relevant Orders   Lipid panel      Polypharmacy           Assessment and Plan Assessment & Plan Polypharmacy Concern due to multiple psychotropic and seizure medications. No side effects reported. Medication list needs updating. - Obtain updated medication list from psychiatrist and neurologist. - Reduce number of medications as appropriate.  Mood d/o Managed with psychiatric medications. No current lithium  use. Regular psychiatric follow-up needed. - Continue psychiatric care. - Obtain updated psychiatric medication list.  Seizure disorder Well-controlled with Keppra . No recent seizures reported. Regular neurologist follow-up needed. - Continue Keppra . - Obtain updated neurologist medication list.  Type 1 diabetes mellitus Managed with insulin  on a sliding scale. Regular endocrinology follow-up in place. - Continue current insulin  regimen. - Maintain regular endocrinology follow-up.  Impacted cerumen, right ear Impacted cerumen cleared today. - Consider ENT referral if symptoms persist.      No follow-ups on file.   Vinary K Samanyu Tinnell, MD [facility] at [facility]   I personally spent a total of 45 minutes in the care of the patient today including preparing to see the patient, getting/reviewing separately obtained history, performing a medically appropriate exam/evaluation, counseling and educating, and independently interpreting results.

## 2024-06-05 LAB — CBC WITH DIFFERENTIAL/PLATELET
Basophils Absolute: 0 x10E3/uL (ref 0.0–0.2)
Basos: 0 %
EOS (ABSOLUTE): 0 x10E3/uL (ref 0.0–0.4)
Eos: 0 %
Hematocrit: 45.6 % (ref 37.5–51.0)
Hemoglobin: 14.9 g/dL (ref 13.0–17.7)
Immature Grans (Abs): 0 x10E3/uL (ref 0.0–0.1)
Immature Granulocytes: 0 %
Lymphocytes Absolute: 1.1 x10E3/uL (ref 0.7–3.1)
Lymphs: 29 %
MCH: 29 pg (ref 26.6–33.0)
MCHC: 32.7 g/dL (ref 31.5–35.7)
MCV: 89 fL (ref 79–97)
Monocytes Absolute: 0.2 x10E3/uL (ref 0.1–0.9)
Monocytes: 7 %
Neutrophils Absolute: 2.3 x10E3/uL (ref 1.4–7.0)
Neutrophils: 64 %
Platelets: 171 x10E3/uL (ref 150–450)
RBC: 5.14 x10E6/uL (ref 4.14–5.80)
RDW: 12.8 % (ref 11.6–15.4)
WBC: 3.6 x10E3/uL (ref 3.4–10.8)

## 2024-06-05 LAB — HEMOGLOBIN A1C
Est. average glucose Bld gHb Est-mCnc: 163 mg/dL
Hgb A1c MFr Bld: 7.3 % — ABNORMAL HIGH (ref 4.8–5.6)

## 2024-06-05 LAB — COMPREHENSIVE METABOLIC PANEL WITH GFR
ALT: 11 IU/L (ref 0–44)
AST: 14 IU/L (ref 0–40)
Albumin: 5 g/dL (ref 4.3–5.2)
Alkaline Phosphatase: 99 IU/L (ref 47–123)
BUN/Creatinine Ratio: 11 (ref 9–20)
BUN: 17 mg/dL (ref 6–20)
Bilirubin Total: 1.2 mg/dL (ref 0.0–1.2)
CO2: 24 mmol/L (ref 20–29)
Calcium: 9.8 mg/dL (ref 8.7–10.2)
Chloride: 100 mmol/L (ref 96–106)
Creatinine, Ser: 1.49 mg/dL — ABNORMAL HIGH (ref 0.76–1.27)
Globulin, Total: 2.1 g/dL (ref 1.5–4.5)
Glucose: 245 mg/dL — ABNORMAL HIGH (ref 70–99)
Potassium: 4.4 mmol/L (ref 3.5–5.2)
Sodium: 140 mmol/L (ref 134–144)
Total Protein: 7.1 g/dL (ref 6.0–8.5)
eGFR: 67 mL/min/1.73

## 2024-06-05 LAB — LIPID PANEL
Chol/HDL Ratio: 3.7 ratio (ref 0.0–5.0)
Cholesterol, Total: 171 mg/dL (ref 100–199)
HDL: 46 mg/dL
LDL Chol Calc (NIH): 110 mg/dL — ABNORMAL HIGH (ref 0–99)
Triglycerides: 82 mg/dL (ref 0–149)
VLDL Cholesterol Cal: 15 mg/dL (ref 5–40)

## 2024-06-05 LAB — MICROALBUMIN / CREATININE URINE RATIO

## 2024-06-06 ENCOUNTER — Ambulatory Visit: Payer: Self-pay | Admitting: Family Medicine

## 2024-06-06 NOTE — Progress Notes (Signed)
 Your kidney function worsened slightly please make sure you hydrate well and avoid pain pills like Aleve, Advil, ibuprofen, motrin and other similar ones. Preferably take Tylenol  if needed for pain.   Your blood sugars are elevated due to diabetes recommend follow up with Endocrinology.  Your bad cholesterol is elevated recommend healthy diet.

## 2024-12-23 ENCOUNTER — Ambulatory Visit: Payer: MEDICAID | Admitting: Family Medicine
